# Patient Record
Sex: Female | Born: 1990
Health system: Southern US, Community
[De-identification: ages and names within clinical notes are randomized; demographics above are authoritative.]

## PROBLEM LIST (undated history)

## (undated) ENCOUNTER — Inpatient Hospital Stay (HOSPITAL_COMMUNITY): Payer: Self-pay

## (undated) DIAGNOSIS — Z87440 Personal history of urinary (tract) infections: Secondary | ICD-10-CM

## (undated) DIAGNOSIS — A749 Chlamydial infection, unspecified: Secondary | ICD-10-CM

## (undated) DIAGNOSIS — A549 Gonococcal infection, unspecified: Secondary | ICD-10-CM

## (undated) DIAGNOSIS — Z8619 Personal history of other infectious and parasitic diseases: Secondary | ICD-10-CM

## (undated) DIAGNOSIS — K219 Gastro-esophageal reflux disease without esophagitis: Secondary | ICD-10-CM

## (undated) HISTORY — PX: WISDOM TOOTH EXTRACTION: SHX21

## (undated) HISTORY — DX: Gastro-esophageal reflux disease without esophagitis: K21.9

## (undated) HISTORY — DX: Personal history of other infectious and parasitic diseases: Z86.19

## (undated) HISTORY — DX: Personal history of urinary (tract) infections: Z87.440

---
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
## 2012-01-03 ENCOUNTER — Encounter: Payer: Self-pay | Admitting: Registered Nurse

## 2012-01-04 ENCOUNTER — Other Ambulatory Visit: Payer: Medicaid Other

## 2012-01-04 ENCOUNTER — Encounter: Payer: Medicaid Other | Admitting: Obstetrics and Gynecology

## 2012-01-05 ENCOUNTER — Ambulatory Visit (INDEPENDENT_AMBULATORY_CARE_PROVIDER_SITE_OTHER): Payer: Medicaid Other | Admitting: Obstetrics and Gynecology

## 2012-01-05 ENCOUNTER — Other Ambulatory Visit: Payer: Medicaid Other

## 2012-01-05 ENCOUNTER — Encounter: Payer: Self-pay | Admitting: Obstetrics and Gynecology

## 2012-01-05 VITALS — BP 100/60 | Wt 131.0 lb

## 2012-01-05 DIAGNOSIS — R81 Glycosuria: Secondary | ICD-10-CM

## 2012-01-05 DIAGNOSIS — Z331 Pregnant state, incidental: Secondary | ICD-10-CM

## 2012-01-05 LAB — POCT CBG (FASTING - GLUCOSE)-MANUAL ENTRY: Glucose Fasting, POC: 72 mg/dL (ref 70–99)

## 2012-01-05 NOTE — Progress Notes (Signed)
Pt states no concerns today.   

## 2012-01-05 NOTE — Patient Instructions (Signed)
Fetal Movement Counts Patient Name: __________________________________________________ Patient Due Date: ____________________ Kick counts is highly recommended in high risk pregnancies, but it is a good idea for every pregnant woman to do. Start counting fetal movements at 28 weeks of the pregnancy. Fetal movements increase after eating a full meal or eating or drinking something sweet (the blood sugar is higher). It is also important to drink plenty of fluids (well hydrated) before doing the count. Lie on your left side because it helps with the circulation or you can sit in a comfortable chair with your arms over your belly (abdomen) with no distractions around you. DOING THE COUNT  Try to do the count the same time of day each time you do it.   Mark the day and time, then see how long it takes for you to feel 10 movements (kicks, flutters, swishes, rolls). You should have at least 10 movements within 2 hours. You will most likely feel 10 movements in much less than 2 hours. If you do not, wait an hour and count again. After a couple of days you will see a pattern.   What you are looking for is a change in the pattern or not enough counts in 2 hours. Is it taking longer in time to reach 10 movements?  SEEK MEDICAL CARE IF:  You feel less than 10 counts in 2 hours. Tried twice.   No movement in one hour.   The pattern is changing or taking longer each day to reach 10 counts in 2 hours.   You feel the baby is not moving as it usually does.  Date: ____________ Movements: ____________ Start time: ____________ Finish time: ____________  Date: ____________ Movements: ____________ Start time: ____________ Finish time: ____________ Date: ____________ Movements: ____________ Start time: ____________ Finish time: ____________ Date: ____________ Movements: ____________ Start time: ____________ Finish time: ____________ Date: ____________ Movements: ____________ Start time: ____________ Finish time:  ____________ Date: ____________ Movements: ____________ Start time: ____________ Finish time: ____________ Date: ____________ Movements: ____________ Start time: ____________ Finish time: ____________ Date: ____________ Movements: ____________ Start time: ____________ Finish time: ____________  Date: ____________ Movements: ____________ Start time: ____________ Finish time: ____________ Date: ____________ Movements: ____________ Start time: ____________ Finish time: ____________ Date: ____________ Movements: ____________ Start time: ____________ Finish time: ____________ Date: ____________ Movements: ____________ Start time: ____________ Finish time: ____________ Date: ____________ Movements: ____________ Start time: ____________ Finish time: ____________ Date: ____________ Movements: ____________ Start time: ____________ Finish time: ____________ Date: ____________ Movements: ____________ Start time: ____________ Finish time: ____________  Date: ____________ Movements: ____________ Start time: ____________ Finish time: ____________ Date: ____________ Movements: ____________ Start time: ____________ Finish time: ____________ Date: ____________ Movements: ____________ Start time: ____________ Finish time: ____________ Date: ____________ Movements: ____________ Start time: ____________ Finish time: ____________ Date: ____________ Movements: ____________ Start time: ____________ Finish time: ____________ Date: ____________ Movements: ____________ Start time: ____________ Finish time: ____________ Date: ____________ Movements: ____________ Start time: ____________ Finish time: ____________  Date: ____________ Movements: ____________ Start time: ____________ Finish time: ____________ Date: ____________ Movements: ____________ Start time: ____________ Finish time: ____________ Date: ____________ Movements: ____________ Start time: ____________ Finish time: ____________ Date: ____________ Movements:  ____________ Start time: ____________ Finish time: ____________ Date: ____________ Movements: ____________ Start time: ____________ Finish time: ____________ Date: ____________ Movements: ____________ Start time: ____________ Finish time: ____________ Date: ____________ Movements: ____________ Start time: ____________ Finish time: ____________  Date: ____________ Movements: ____________ Start time: ____________ Finish time: ____________ Date: ____________ Movements: ____________ Start time: ____________ Finish time: ____________ Date: ____________ Movements: ____________ Start time:   ____________ Finish time: ____________ Date: ____________ Movements: ____________ Start time: ____________ Finish time: ____________ Date: ____________ Movements: ____________ Start time: ____________ Finish time: ____________ Date: ____________ Movements: ____________ Start time: ____________ Finish time: ____________ Date: ____________ Movements: ____________ Start time: ____________ Finish time: ____________  Date: ____________ Movements: ____________ Start time: ____________ Finish time: ____________ Date: ____________ Movements: ____________ Start time: ____________ Finish time: ____________ Date: ____________ Movements: ____________ Start time: ____________ Finish time: ____________ Date: ____________ Movements: ____________ Start time: ____________ Finish time: ____________ Date: ____________ Movements: ____________ Start time: ____________ Finish time: ____________ Date: ____________ Movements: ____________ Start time: ____________ Finish time: ____________ Date: ____________ Movements: ____________ Start time: ____________ Finish time: ____________  Date: ____________ Movements: ____________ Start time: ____________ Finish time: ____________ Date: ____________ Movements: ____________ Start time: ____________ Finish time: ____________ Date: ____________ Movements: ____________ Start time: ____________ Finish  time: ____________ Date: ____________ Movements: ____________ Start time: ____________ Finish time: ____________ Date: ____________ Movements: ____________ Start time: ____________ Finish time: ____________ Date: ____________ Movements: ____________ Start time: ____________ Finish time: ____________ Date: ____________ Movements: ____________ Start time: ____________ Finish time: ____________  Date: ____________ Movements: ____________ Start time: ____________ Finish time: ____________ Date: ____________ Movements: ____________ Start time: ____________ Finish time: ____________ Date: ____________ Movements: ____________ Start time: ____________ Finish time: ____________ Date: ____________ Movements: ____________ Start time: ____________ Finish time: ____________ Date: ____________ Movements: ____________ Start time: ____________ Finish time: ____________ Date: ____________ Movements: ____________ Start time: ____________ Finish time: ____________ Document Released: 08/18/2006 Document Revised: 07/08/2011 Document Reviewed: 02/18/2009 Exit[employer] Patient Information 2012 [employer]. 

## 2012-01-05 NOTE — Progress Notes (Signed)
Addended by: Janeece Agee on: 01/05/2012 11:05 AM   Modules accepted: Orders

## 2012-01-05 NOTE — Progress Notes (Signed)
A/P GBS positive.  gc and chlamydia done today Fetal kick counts reviewed Labor reviewed with pt All patients  questions answered

## 2012-01-10 ENCOUNTER — Telehealth: Payer: Self-pay

## 2012-01-10 MED ORDER — AZITHROMYCIN 250 MG PO TABS: ORAL_TABLET | ORAL | Status: AC

## 2012-01-10 NOTE — Telephone Encounter (Signed)
Message copied by Rolla Plate on Mon Jan 10, 2012 10:36 AM ------      Message from: Jaymes Graff      Created: Sat Jan 08, 2012 11:03 PM       Please give the pt Zithromax 250 mg.  Take four tablets at once.  No refills. May treat partner per protocol guidelines.

## 2012-01-10 NOTE — Telephone Encounter (Signed)
Lm on v tcb rgd labs 

## 2012-01-10 NOTE — Telephone Encounter (Signed)
Spoke with Tracy Haynes rgd labs informed gc neg ct positive need treatment Tracy Haynes wants rx sent to [facility] advised Tracy Haynes rx sent to pharm no intercourse until after toc Tracy Haynes has appt for toc on 01/27/12 at 1045 with SL Tracy Haynes voice understanding also informed Tracy Haynes to notify partner of ct so he can get treatment Tracy Haynes voice understanding

## 2012-01-12 ENCOUNTER — Encounter: Payer: Self-pay | Admitting: Obstetrics and Gynecology

## 2012-01-12 ENCOUNTER — Ambulatory Visit (INDEPENDENT_AMBULATORY_CARE_PROVIDER_SITE_OTHER): Payer: Medicaid Other | Admitting: Obstetrics and Gynecology

## 2012-01-12 VITALS — BP 90/56 | Wt 132.0 lb

## 2012-01-12 DIAGNOSIS — N760 Acute vaginitis: Secondary | ICD-10-CM

## 2012-01-12 DIAGNOSIS — A499 Bacterial infection, unspecified: Secondary | ICD-10-CM

## 2012-01-12 DIAGNOSIS — Z3490 Encounter for supervision of normal pregnancy, unspecified, unspecified trimester: Secondary | ICD-10-CM

## 2012-01-12 DIAGNOSIS — B9689 Other specified bacterial agents as the cause of diseases classified elsewhere: Secondary | ICD-10-CM | POA: Insufficient documentation

## 2012-01-12 DIAGNOSIS — Z331 Pregnant state, incidental: Secondary | ICD-10-CM

## 2012-01-12 LAB — POCT WET PREP (WET MOUNT): Clue Cells Wet Prep Whiff POC: NEGATIVE

## 2012-01-12 MED ORDER — METRONIDAZOLE 500 MG PO TABS: 500.0000 mg | ORAL_TABLET | Freq: Two times a day (BID) | ORAL | Status: AC

## 2012-01-12 NOTE — Addendum Note (Signed)
Addended by: Janeece Agee on: 01/12/2012 05:22 PM   Modules accepted: Orders

## 2012-01-12 NOTE — Progress Notes (Signed)
Pt had soda before visit today

## 2012-01-12 NOTE — Progress Notes (Signed)
Doing well.  Received rx for Zithromax for chlamydia.  Now c/o yellow d/c with odor. Wet prep BV--Rx MTZ 500 mg po BID x 7 days.

## 2012-01-12 NOTE — Addendum Note (Signed)
Addended by: Cornelius Moras on: 01/12/2012 05:31 PM   Modules accepted: Orders

## 2012-01-19 ENCOUNTER — Encounter: Payer: Self-pay | Admitting: Obstetrics and Gynecology

## 2012-01-19 ENCOUNTER — Ambulatory Visit (INDEPENDENT_AMBULATORY_CARE_PROVIDER_SITE_OTHER): Payer: Medicaid Other | Admitting: Obstetrics and Gynecology

## 2012-01-19 VITALS — BP 90/58 | Wt 134.0 lb

## 2012-01-19 DIAGNOSIS — A749 Chlamydial infection, unspecified: Secondary | ICD-10-CM

## 2012-01-19 DIAGNOSIS — O98319 Other infections with a predominantly sexual mode of transmission complicating pregnancy, unspecified trimester: Secondary | ICD-10-CM

## 2012-01-19 DIAGNOSIS — A568 Sexually transmitted chlamydial infection of other sites: Secondary | ICD-10-CM

## 2012-01-19 NOTE — Progress Notes (Signed)
Doing well--took Zmax last week. Plan TOC in 3 weeks, or when admitted in labor. Cervix very favorable. Does not plan waterbirth.

## 2012-01-19 NOTE — Progress Notes (Signed)
reqs cx check  Today

## 2012-01-27 ENCOUNTER — Ambulatory Visit (INDEPENDENT_AMBULATORY_CARE_PROVIDER_SITE_OTHER): Payer: Medicaid Other | Admitting: Obstetrics and Gynecology

## 2012-01-27 ENCOUNTER — Encounter: Payer: Self-pay | Admitting: Obstetrics and Gynecology

## 2012-01-27 VITALS — BP 104/60 | Wt 136.0 lb

## 2012-01-27 DIAGNOSIS — Z331 Pregnant state, incidental: Secondary | ICD-10-CM

## 2012-01-27 NOTE — Progress Notes (Signed)
C/o pain in both legs  cx check Too early for TOC

## 2012-01-27 NOTE — Progress Notes (Signed)
[redacted]w[redacted]d GBS done today - will cancel, swab was pos in May, plan to tx in labor Will do TOC NV Denies CTX Swept membranes rv'd FKC and labor sx's

## 2012-01-27 NOTE — Patient Instructions (Signed)

## 2012-02-01 ENCOUNTER — Encounter: Payer: Self-pay | Admitting: Obstetrics and Gynecology

## 2012-02-01 ENCOUNTER — Ambulatory Visit (INDEPENDENT_AMBULATORY_CARE_PROVIDER_SITE_OTHER): Payer: Medicaid Other | Admitting: Obstetrics and Gynecology

## 2012-02-01 ENCOUNTER — Telehealth: Payer: Self-pay | Admitting: Obstetrics and Gynecology

## 2012-02-01 ENCOUNTER — Other Ambulatory Visit: Payer: Medicaid Other

## 2012-02-01 VITALS — BP 90/58 | Wt 139.0 lb

## 2012-02-01 DIAGNOSIS — Z8619 Personal history of other infectious and parasitic diseases: Secondary | ICD-10-CM

## 2012-02-01 DIAGNOSIS — O480 Post-term pregnancy: Secondary | ICD-10-CM

## 2012-02-01 NOTE — Progress Notes (Signed)
No concerns today 

## 2012-02-01 NOTE — Progress Notes (Signed)
Doing well.  Reviewed post-dates management. Patient declines induction prior to 66 weeks--R&B reviewed, with risks of LGA, prolonged labor, shoulder dystocia, etc. Will do NST, [place] for growth, BPP, AFI  At 41 weeks. Cervix very favorable, membranes swept. TOC Chlamydia today. Reviewed + GBS and plan for treatment in labor.

## 2012-02-01 NOTE — Telephone Encounter (Signed)
TC to Tracy Haynes in response to VM regarding treatment for +Chlamydia.  Informed was treated 01/08/12 and TOC will be done in 3 weeks from that date or when in labor.

## 2012-02-01 NOTE — Addendum Note (Signed)
Addended by: Janeece Agee on: 02/01/2012 12:23 PM   Modules accepted: Orders

## 2012-02-03 ENCOUNTER — Encounter: Payer: Self-pay | Admitting: Obstetrics and Gynecology

## 2012-02-03 LAB — GC/CHLAMYDIA PROBE AMP, GENITAL
Chlamydia, DNA Probe: UNDETERMINED
GC Probe Amp, Genital: NEGATIVE

## 2012-02-04 ENCOUNTER — Telehealth: Payer: Self-pay | Admitting: Obstetrics and Gynecology

## 2012-02-04 NOTE — Telephone Encounter (Signed)
Spoke to patient about test results and informed her that we would have to retest for GC/CT on her next appointment on 02/07/2012 because of indeterminate test results.  Patient voiced understanding & acknowledgement.Marland KitchenMarland KitchenMarland KitchenBerdine Dance CMA

## 2012-02-07 ENCOUNTER — Encounter: Payer: Self-pay | Admitting: Obstetrics and Gynecology

## 2012-02-07 ENCOUNTER — Ambulatory Visit (INDEPENDENT_AMBULATORY_CARE_PROVIDER_SITE_OTHER): Payer: Medicaid Other | Admitting: Obstetrics and Gynecology

## 2012-02-07 ENCOUNTER — Ambulatory Visit (INDEPENDENT_AMBULATORY_CARE_PROVIDER_SITE_OTHER): Payer: Medicaid Other

## 2012-02-07 ENCOUNTER — Telehealth: Payer: Self-pay | Admitting: Emergency Medicine

## 2012-02-07 ENCOUNTER — Telehealth: Payer: Self-pay | Admitting: Obstetrics and Gynecology

## 2012-02-07 ENCOUNTER — Other Ambulatory Visit: Payer: Medicaid Other | Admitting: Obstetrics and Gynecology

## 2012-02-07 VITALS — BP 116/68 | Wt 140.0 lb

## 2012-02-07 DIAGNOSIS — Z3400 Encounter for supervision of normal first pregnancy, unspecified trimester: Secondary | ICD-10-CM

## 2012-02-07 DIAGNOSIS — O98319 Other infections with a predominantly sexual mode of transmission complicating pregnancy, unspecified trimester: Secondary | ICD-10-CM

## 2012-02-07 DIAGNOSIS — A549 Gonococcal infection, unspecified: Secondary | ICD-10-CM

## 2012-02-07 DIAGNOSIS — O480 Post-term pregnancy: Secondary | ICD-10-CM

## 2012-02-07 DIAGNOSIS — A5400 Gonococcal infection of lower genitourinary tract, unspecified: Secondary | ICD-10-CM

## 2012-02-07 DIAGNOSIS — A749 Chlamydial infection, unspecified: Secondary | ICD-10-CM

## 2012-02-07 DIAGNOSIS — A568 Sexually transmitted chlamydial infection of other sites: Secondary | ICD-10-CM

## 2012-02-07 DIAGNOSIS — Z331 Pregnant state, incidental: Secondary | ICD-10-CM

## 2012-02-07 LAB — US OB COMP + 14 WK

## 2012-02-07 NOTE — Progress Notes (Signed)
NO COMPLAINTS. PT REQUEST CX CHECK. NST reactive: BPP8/8 AFI NL Menbranes stripped GC/CH done Will schedule induction

## 2012-02-07 NOTE — Patient Instructions (Signed)
Labor Induction  Most women go into labor on their own between 37 and 42 weeks of the pregnancy. When this does not happen or when there is a medical need, medicine or other methods may be used to induce labor. Labor induction causes a pregnant woman's uterus to contract. It also causes the cervix to soften (ripen), open (dilate), and thin out (efface). Usually, labor is not induced before 39 weeks of the pregnancy unless there is a problem with the baby or mother. Whether your labor will be induced depends on a number of factors, including the following:  The medical condition of you and the baby.   How many weeks along you are.   The status of baby's lung maturity.   The condition of the cervix.   The position of the baby.  REASONS FOR LABOR INDUCTION  The health of the baby or mother is at risk.   The pregnancy is overdue by 1 week or more.   The water breaks but labor does not start on its own.   The mother has a health condition or serious illness such as high blood pressure, infection, placental abruption, or diabetes.   The amniotic fluid amounts are low around the baby.   The baby is distressed.  REASONS TO NOT INDUCE LABOR Labor induction may not be a good idea if:  It is shown that your baby does not tolerate labor.   An induction is just more convenient.   You want the baby to be born on a certain date, like a holiday.   You have had previous surgeries on your uterus, such as a myomectomy or the removal of fibroids.   Your placenta lies very low in the uterus and blocks the opening of the cervix (placenta previa).   Your baby is not in a head down position.   The umbilical cord drops down into the birth canal in front of the baby. This could cut off the baby's blood and oxygen supply.   You have had a previous cesarean delivery.   There areunusual circumstances, such as the baby being extremely premature.  RISKS AND COMPLICATIONS Problems may occur in the  process of induction and plans may need to be modified as a situation unfolds. Some of the risks of induction include:  Change in fetal heart rate, such as too high, too low, or erratic.   Risk of fetal distress.   Risk of infection to mother and baby.   Increased chance of having a cesarean delivery.   The rare, but increased chance that the placenta will separate from the uterus (abruption).   Uterine rupture (very rare).  When induction is needed for medical reasons, the benefits of induction may outweigh the risks. BEFORE THE PROCEDURE Your caregiver will check your cervix and the baby's position. This will help your caregiver decide if you are far enough along for an induction to work. PROCEDURE Several methods of labor induction may be used, such as:   Taking prostaglandin medicine to dilate and ripen the cervix. The medicine will also start contractions. It can be taken by mouth or by inserting a suppository into the vagina.   A thin tube (catheter) with a balloon on the end may be inserted into your vagina to dilate the cervix. Once inserted, the balloon expands with water, which causes the cervix to open.   Striping the membranes. Your caregiver inserts a finger between the cervix and membranes, which causes the cervix to be stretched and   may cause the uterus to contract. This is often done during an office visit. You will be sent home to wait for the contractions to begin. You will then come in for an induction.   Breaking the water. Your caregiver will make a hole in the amniotic sac using a small instrument. Once the amniotic sac breaks, contractions should begin. This may still take hours to see an effect.   Taking medicine to trigger or strengthen contractions. This medicine is given intravenously through a tube in your arm.  All of the methods of induction, besides stripping the membranes, will be done in the hospital. Induction is done in the hospital so that you and the  baby can be carefully monitored. AFTER THE PROCEDURE Some inductions can take up to 2 or 3 days. Depending on the cervix, it usually takes less time. It takes longer when you are induced early in the pregnancy or if this is your first pregnancy. If a mother is still pregnant and the induction has been going on for 2 to 3 days, either the mother will be sent home or a cesarean delivery will be needed. Document Released: 12/08/2006 Document Revised: 07/08/2011 Document Reviewed: 05/24/2011 Exit[employer] Patient Information 2012 [employer]. 

## 2012-02-08 ENCOUNTER — Telehealth (HOSPITAL_COMMUNITY): Payer: Self-pay | Admitting: *Deleted

## 2012-02-08 ENCOUNTER — Inpatient Hospital Stay (HOSPITAL_COMMUNITY): Admission: RE | Admit: 2012-02-08 | Payer: Medicaid Other | Source: Ambulatory Visit

## 2012-02-08 ENCOUNTER — Telehealth: Payer: Self-pay | Admitting: Obstetrics and Gynecology

## 2012-02-08 ENCOUNTER — Inpatient Hospital Stay (HOSPITAL_COMMUNITY)
Admission: RE | Admit: 2012-02-08 | Discharge: 2012-02-11 | DRG: 775 | Disposition: A | Discharge status: Home / Self Care | Payer: Medicaid Other | Source: Ambulatory Visit | Attending: Obstetrics and Gynecology | Admitting: Obstetrics and Gynecology

## 2012-02-08 ENCOUNTER — Encounter (HOSPITAL_COMMUNITY): Payer: Self-pay | Admitting: *Deleted

## 2012-02-08 ENCOUNTER — Encounter (HOSPITAL_COMMUNITY): Payer: Self-pay

## 2012-02-08 ENCOUNTER — Other Ambulatory Visit: Payer: Self-pay | Admitting: Obstetrics and Gynecology

## 2012-02-08 DIAGNOSIS — O99892 Other specified diseases and conditions complicating childbirth: Secondary | ICD-10-CM | POA: Diagnosis present

## 2012-02-08 DIAGNOSIS — Z22330 Carrier of Group B streptococcus: Secondary | ICD-10-CM

## 2012-02-08 DIAGNOSIS — O9903 Anemia complicating the puerperium: Secondary | ICD-10-CM | POA: Diagnosis not present

## 2012-02-08 DIAGNOSIS — O480 Post-term pregnancy: Principal | ICD-10-CM | POA: Diagnosis present

## 2012-02-08 DIAGNOSIS — S3141XA Laceration without foreign body of vagina and vulva, initial encounter: Secondary | ICD-10-CM | POA: Diagnosis not present

## 2012-02-08 DIAGNOSIS — D649 Anemia, unspecified: Secondary | ICD-10-CM | POA: Diagnosis not present

## 2012-02-08 LAB — CBC
HCT: 32.1 % — ABNORMAL LOW (ref 36.0–46.0)
Hemoglobin: 10.8 g/dL — ABNORMAL LOW (ref 12.0–15.0)
RDW: 13.3 % (ref 11.5–15.5)
WBC: 10.3 10*3/uL (ref 4.0–10.5)

## 2012-02-08 MED ORDER — LACTATED RINGERS IV SOLN
INTRAVENOUS | Status: DC
  Administered 2012-02-08 – 2012-02-09 (×2): via INTRAVENOUS

## 2012-02-08 MED ORDER — LACTATED RINGERS IV SOLN: 500.0000 mL | INTRAVENOUS | Status: DC | PRN

## 2012-02-08 MED ORDER — PENICILLIN G POTASSIUM 5000000 UNITS IJ SOLR
5.0000 10*6.[IU] | Freq: Once | INTRAVENOUS | Status: AC
  Administered 2012-02-08: 5 10*6.[IU] via INTRAVENOUS
  Filled 2012-02-08: qty 5

## 2012-02-08 MED ORDER — FLEET ENEMA 7-19 GM/118ML RE ENEM: 1.0000 | ENEMA | RECTAL | Status: DC | PRN

## 2012-02-08 MED ORDER — PHENYLEPHRINE 40 MCG/ML (10ML) SYRINGE FOR IV PUSH (FOR BLOOD PRESSURE SUPPORT): 80.0000 ug | PREFILLED_SYRINGE | INTRAVENOUS | Status: DC | PRN

## 2012-02-08 MED ORDER — PENICILLIN G POTASSIUM 5000000 UNITS IJ SOLR
2.5000 10*6.[IU] | INTRAVENOUS | Status: DC
  Administered 2012-02-09 (×3): 2.5 10*6.[IU] via INTRAVENOUS
  Filled 2012-02-08 (×5): qty 2.5

## 2012-02-08 MED ORDER — OXYTOCIN 40 UNITS IN LACTATED RINGERS INFUSION - SIMPLE MED
62.5000 mL/h | Freq: Once | INTRAVENOUS | Status: DC
  Filled 2012-02-08: qty 1000

## 2012-02-08 MED ORDER — CITRIC ACID-SODIUM CITRATE 334-500 MG/5ML PO SOLN: 30.0000 mL | ORAL | Status: DC | PRN

## 2012-02-08 MED ORDER — OXYTOCIN BOLUS FROM INFUSION
250.0000 mL | Freq: Once | INTRAVENOUS | Status: AC
  Administered 2012-02-09: 250 mL via INTRAVENOUS
  Filled 2012-02-08: qty 500

## 2012-02-08 MED ORDER — DIPHENHYDRAMINE HCL 50 MG/ML IJ SOLN: 12.5000 mg | INTRAMUSCULAR | Status: DC | PRN

## 2012-02-08 MED ORDER — OXYCODONE-ACETAMINOPHEN 5-325 MG PO TABS: 1.0000 | ORAL_TABLET | ORAL | Status: DC | PRN

## 2012-02-08 MED ORDER — BUTORPHANOL TARTRATE 2 MG/ML IJ SOLN: 2.0000 mg | INTRAMUSCULAR | Status: DC | PRN

## 2012-02-08 MED ORDER — EPHEDRINE 5 MG/ML INJ: 10.0000 mg | INTRAVENOUS | Status: DC | PRN

## 2012-02-08 MED ORDER — PROMETHAZINE HCL 25 MG/ML IJ SOLN: 12.5000 mg | INTRAMUSCULAR | Status: DC | PRN

## 2012-02-08 MED ORDER — ONDANSETRON HCL 4 MG/2ML IJ SOLN: 4.0000 mg | Freq: Four times a day (QID) | INTRAMUSCULAR | Status: DC | PRN

## 2012-02-08 MED ORDER — FENTANYL 2.5 MCG/ML BUPIVACAINE 1/10 % EPIDURAL INFUSION (WH - ANES): 14.0000 mL/h | INTRAMUSCULAR | Status: DC

## 2012-02-08 MED ORDER — LACTATED RINGERS IV SOLN: 500.0000 mL | Freq: Once | INTRAVENOUS | Status: DC

## 2012-02-08 MED ORDER — IBUPROFEN 600 MG PO TABS: 600.0000 mg | ORAL_TABLET | Freq: Four times a day (QID) | ORAL | Status: DC | PRN

## 2012-02-08 MED ORDER — LIDOCAINE HCL (PF) 1 % IJ SOLN
30.0000 mL | INTRAMUSCULAR | Status: DC | PRN
Start: 2012-02-08 — End: 2012-02-09
  Administered 2012-02-09: 30 mL via SUBCUTANEOUS
  Filled 2012-02-08: qty 30

## 2012-02-08 MED ORDER — ACETAMINOPHEN 325 MG PO TABS: 650.0000 mg | ORAL_TABLET | ORAL | Status: DC | PRN

## 2012-02-08 NOTE — H&P (Signed)
Tracy Haynes is a 21 y.o. female presenting for induction. Tracy Haynes  21 y.o. 65w1denies uc, denies srom, vag bleeding, with +FM, History Hx of +GC/+CHL with pg with test of cure neg 02/01/12 OB History    Grav Para Term Preterm Abortions TAB SAB Ect Mult Living   1 0 0 0 0 0 0 0 0 0      Past Medical History  Diagnosis Date  . Chlamydia infection   . Gonorrhea   . H/O varicella   . Hx: UTI (urinary tract infection)    Past Surgical History  Procedure Date  . Wisdom tooth extraction    Family History: family history includes Alcohol abuse in her father; Arthritis in her mother; Asthma in her brother; Diabetes in her maternal aunt; Hypertension in her mother; Thalassemia in her brother; and Thyroid disease in her mother.  There is no history of Anesthesia problems. Social History:  reports that she has never smoked. She has never used smokeless tobacco. She reports that she does not drink alcohol or use illicit drugs.   Prenatal Transfer Tool  Maternal Diabetes: No Genetic Screening: Normal Maternal Ultrasounds/Referrals: Normal Fetal Ultrasounds or other Referrals:  None Maternal Substance Abuse:  No Significant Maternal Medications:  None Significant Maternal Lab Results:  Lab values include: Group B Strep positive Other Comments:  None  ROS  Dilation: 2 Effacement (%): 90 Station: -1;0 Exam by:: Tracy Haynes, cnm Blood pressure 125/75, pulse 99, temperature 98 F (36.7 C), temperature source Oral, resp. rate 20, height 5\' 1"  (1.549 m), weight 140 lb (63.504 kg), last menstrual period 04/06/2011. Exam Physical Exam Calm, no distress, HEENT WNL,  lungs clear bilaterally, AP RRR, abd soft nt,no masses, not tympanic bowel sounds active, abdomen nontender, EFW 7#0 Vtx per exam intact No edema to lower extremities, foley bulb placed easily with pt consent Fhts category 1 uc q 2 mild  Prenatal labs: ABO, Rh: B/Positive/-- (01/14 0000) Antibody: Negative (01/14  0000) Rubella: Immune (01/14 0000) RPR: NON REAC (04/17 1652)  HBsAg: Negative (01/14 0000)  HIV: Non-reactive (01/14 0000)  GBS: POSITIVE (06/27 1154)  US anatomy WNL Assessment/Plan: 41 1/7 week IUP GBS+ Induction postdates Routine admission, foley bulb placed discussed IV pitocin if indicated, Pen G started, collaboration with Dr. Pennie Rushing per telephone.  Tracy Haynes 02/08/2012, 9:49 PM

## 2012-02-08 NOTE — Telephone Encounter (Signed)
Preadmission screen  

## 2012-02-08 NOTE — Telephone Encounter (Signed)
Induction REscheduled to 02/08/12 @ 7:30pm with MK/VH, per patient request. -Adrianne Pridgen

## 2012-02-08 NOTE — Progress Notes (Signed)
Breathing well uc O VSS      fhts +      abd soft between uc      Contractions q 2-4 mild to mod      Vag 4 100 foley bulb remains in A  41 week induction P continue care Lavera Guise, CNM

## 2012-02-09 ENCOUNTER — Inpatient Hospital Stay (HOSPITAL_COMMUNITY): Admission: RE | Admit: 2012-02-09 | Payer: Medicaid Other | Source: Ambulatory Visit

## 2012-02-09 ENCOUNTER — Encounter (HOSPITAL_COMMUNITY): Payer: Self-pay

## 2012-02-09 DIAGNOSIS — S3141XA Laceration without foreign body of vagina and vulva, initial encounter: Secondary | ICD-10-CM | POA: Diagnosis not present

## 2012-02-09 LAB — RPR: RPR Ser Ql: NONREACTIVE

## 2012-02-09 LAB — ABO/RH: ABO/RH(D): B POS

## 2012-02-09 MED ORDER — TERBUTALINE SULFATE 1 MG/ML IJ SOLN: 0.2500 mg | Freq: Once | INTRAMUSCULAR | Status: DC | PRN

## 2012-02-09 MED ORDER — ONDANSETRON HCL 4 MG PO TABS: 4.0000 mg | ORAL_TABLET | ORAL | Status: DC | PRN

## 2012-02-09 MED ORDER — MAGNESIUM HYDROXIDE 400 MG/5ML PO SUSP: 30.0000 mL | ORAL | Status: DC | PRN

## 2012-02-09 MED ORDER — OXYTOCIN 40 UNITS IN LACTATED RINGERS INFUSION - SIMPLE MED
1.0000 m[IU]/min | INTRAVENOUS | Status: DC
  Administered 2012-02-09: 1 m[IU]/min via INTRAVENOUS

## 2012-02-09 MED ORDER — LANOLIN HYDROUS EX OINT: TOPICAL_OINTMENT | CUTANEOUS | Status: DC | PRN

## 2012-02-09 MED ORDER — BENZOCAINE-MENTHOL 20-0.5 % EX AERO
1.0000 "application " | INHALATION_SPRAY | CUTANEOUS | Status: DC | PRN
  Administered 2012-02-09: 1 via TOPICAL
  Filled 2012-02-09: qty 56

## 2012-02-09 MED ORDER — TETANUS-DIPHTH-ACELL PERTUSSIS 5-2.5-18.5 LF-MCG/0.5 IM SUSP: 0.5000 mL | Freq: Once | INTRAMUSCULAR | Status: DC

## 2012-02-09 MED ORDER — OXYCODONE-ACETAMINOPHEN 5-325 MG PO TABS
1.0000 | ORAL_TABLET | ORAL | Status: DC | PRN
  Administered 2012-02-09 – 2012-02-11 (×8): 1 via ORAL
  Filled 2012-02-09 (×8): qty 1

## 2012-02-09 MED ORDER — WITCH HAZEL-GLYCERIN EX PADS: 1.0000 "application " | MEDICATED_PAD | CUTANEOUS | Status: DC | PRN

## 2012-02-09 MED ORDER — MEDROXYPROGESTERONE ACETATE 150 MG/ML IM SUSP: 150.0000 mg | INTRAMUSCULAR | Status: DC | PRN

## 2012-02-09 MED ORDER — ZOLPIDEM TARTRATE 5 MG PO TABS: 5.0000 mg | ORAL_TABLET | Freq: Every evening | ORAL | Status: DC | PRN

## 2012-02-09 MED ORDER — IBUPROFEN 600 MG PO TABS
600.0000 mg | ORAL_TABLET | Freq: Four times a day (QID) | ORAL | Status: DC
  Administered 2012-02-09 – 2012-02-11 (×7): 600 mg via ORAL
  Filled 2012-02-09 (×7): qty 1

## 2012-02-09 MED ORDER — SENNOSIDES-DOCUSATE SODIUM 8.6-50 MG PO TABS
2.0000 | ORAL_TABLET | Freq: Every day | ORAL | Status: DC
  Administered 2012-02-09 – 2012-02-10 (×2): 2 via ORAL

## 2012-02-09 MED ORDER — DIBUCAINE 1 % RE OINT: 1.0000 "application " | TOPICAL_OINTMENT | RECTAL | Status: DC | PRN

## 2012-02-09 MED ORDER — PRENATAL MULTIVITAMIN CH
1.0000 | ORAL_TABLET | Freq: Every day | ORAL | Status: DC
  Administered 2012-02-09 – 2012-02-11 (×3): 1 via ORAL
  Filled 2012-02-09 (×3): qty 1

## 2012-02-09 MED ORDER — DIPHENHYDRAMINE HCL 25 MG PO CAPS: 25.0000 mg | ORAL_CAPSULE | Freq: Four times a day (QID) | ORAL | Status: DC | PRN

## 2012-02-09 MED ORDER — SIMETHICONE 80 MG PO CHEW: 80.0000 mg | CHEWABLE_TABLET | ORAL | Status: DC | PRN

## 2012-02-09 MED ORDER — ONDANSETRON HCL 4 MG/2ML IJ SOLN: 4.0000 mg | INTRAMUSCULAR | Status: DC | PRN

## 2012-02-09 NOTE — Progress Notes (Signed)
Subjective: Pt resting on Lt side on arrival to room.  Doula and family members at bedside & supportive.  Pt able to rest, but not asleep.  No other c/o's. Pit on 9mu.  Objective: BP 103/47  Pulse 98  Temp 98.6 F (37 C) (Oral)  Resp 18  Ht 5\' 1"  (1.549 m)  Wt 140 lb (63.504 kg)  BMI 26.45 kg/m2  LMP 04/06/2011      FHT:  FHR: 140 bpm, variability: moderate,  accelerations:  Present,  decelerations:  Absent UC:   regular, every 2-3 minutes SVE:   Dilation: 7 Effacement (%): 100 Station: -1;0 Exam by:: C. Teasha Murrillo, CNM AROM at 0807 sm amt of clear fluid; sm amt of bloody show Labs: Lab Results  Component Value Date   WBC 10.3 02/08/2012   HGB 10.8* 02/08/2012   HCT 32.1* 02/08/2012   MCV 83.4 02/08/2012   PLT 286 02/08/2012    Assessment / Plan: 1. 41.2  2.  no change in 2 hrs 3. GBS pos  Labor: protracted active Preeclampsia:  no signs or symptoms of toxicity Fetal Wellbeing:  Category I Pain Control:  Labor support without medications I/D:  n/a Anticipated MOD:  NSVD 1.  Pitocin cut in half and will CTO for labor progression 2.  Support as needed  Annmarie Plemmons H 02/09/2012, 8:15 AM

## 2012-02-09 NOTE — Progress Notes (Signed)
Called by RN foley blub out,  Calm, quiet breathing well with uc O VSS      fhts category 1      abd soft between uc      Contractions 2-6 mild to mod      Vag 6 100 -1/0 VTX Intact foley bulb out A  Foley bulb Induction 412/7 week  P discussed pitocin augmentation and pt concurs, continue care Lavera Guise, CNM

## 2012-02-09 NOTE — Progress Notes (Signed)
Calm with uc O VSS      fhts category 1      abd soft between uc      Contractions q 1-3 mild to mod      Vag 7 100 -1/0 intact A active labor P continue care Lavera Guise, CNM

## 2012-02-10 ENCOUNTER — Inpatient Hospital Stay (HOSPITAL_COMMUNITY): Admission: RE | Admit: 2012-02-10 | Payer: Medicaid Other | Source: Ambulatory Visit

## 2012-02-10 LAB — CBC
MCH: 29 pg (ref 26.0–34.0)
Platelets: 249 10*3/uL (ref 150–400)
RBC: 3.1 MIL/uL — ABNORMAL LOW (ref 3.87–5.11)
WBC: 14 10*3/uL — ABNORMAL HIGH (ref 4.0–10.5)

## 2012-02-10 MED ORDER — POLYSACCHARIDE IRON COMPLEX 150 MG PO CAPS
150.0000 mg | ORAL_CAPSULE | Freq: Two times a day (BID) | ORAL | Status: DC
  Administered 2012-02-10 – 2012-02-11 (×3): 150 mg via ORAL
  Filled 2012-02-10 (×3): qty 1

## 2012-02-10 NOTE — Progress Notes (Signed)
Patient ID: GLORISTINE TURRUBIATES, female   DOB: 03-01-1991, 21 y.o.   MRN: [id number] Post Partum Day 1   Subjective: no complaints, up ad lib without syncope, voiding, tolerating PO, + flatus  Pain well controlled with po meds BF started Mood stable, bonding well Desires nexplanon for contraception    Objective: Blood pressure 101/62, pulse 94, temperature 98.1 F (36.7 C), temperature source Oral, resp. rate 18, height 5\' 1"  (1.549 m), weight 140 lb (63.504 kg), last menstrual period 04/06/2011, unknown if currently breastfeeding.  Physical Exam:  General: alert, fatigued and no distress Lungs: CTAB Heart: RRR Breasts: WNL Lochia: appropriate Uterine Fundus: firm Perineum: healing well approximated  DVT Evaluation: No evidence of DVT seen on physical exam. Negative Homan's sign. No significant calf/ankle edema.   Basename 02/10/12 0610 02/08/12 2040  HGB 9.0* 10.8*  HCT 26.4* 32.1*    Assessment/Plan: Mild anemia FE supplement PO  Plan D/C tmrw BF, lactation consult nexplanon for contraception     LOS: 2 days   Annebelle Bostic M 02/10/2012, 10:43 AM

## 2012-02-10 NOTE — Progress Notes (Signed)
UR Chart review completed.  

## 2012-02-11 MED ORDER — IBUPROFEN 600 MG PO TABS: 600.0000 mg | ORAL_TABLET | Freq: Four times a day (QID) | ORAL | Status: AC | PRN

## 2012-02-11 MED ORDER — DOCUSATE SODIUM 100 MG PO CAPS: 100.0000 mg | ORAL_CAPSULE | Freq: Two times a day (BID) | ORAL | Status: DC

## 2012-02-11 MED ORDER — FERROUS SULFATE 325 (65 FE) MG PO TABS: 325.0000 mg | ORAL_TABLET | Freq: Every day | ORAL | Status: DC

## 2012-02-11 NOTE — Discharge Summary (Signed)
Physician Discharge Summary  Patient ID: ARUNA NESTLER MRN: [id number] DOB/AGE: 1991-07-02 21 y.o.  Admit date: 02/08/2012 Discharge date: 02/11/2012   week Admission Diagnoses: 40 5/7 week IUP induction for post dates  Discharge Diagnoses:  Principal Problem:  *NSVD (normal spontaneous vaginal delivery) Active Problems:  Vaginal laceration anemia  Discharged Condition: stable  Hospital Course: 40 5/7 week IUP induction for post dates, foley bulb, IV pitocin, SVD, 2 degree vag lac, normal involution, anemia   Consults: None  Significant Diagnostic Studies: labs:  Hemoglobin & Hematocrit     Component Value Date/Time   HGB 9.0* 02/10/2012 0610   HCT 26.4* 02/10/2012 0610     Treatments: antibiotics: pen G  Discharge Exam: Blood pressure 100/61, pulse 76, temperature 98.3 F (36.8 C), temperature source Oral, resp. rate 18, height 5\' 1"  (1.549 m), weight 140 lb (63.504 kg), last menstrual period 04/06/2011, unknown if currently breastfeeding. General appearance: alert, cooperative and no distress S: comfortable, little bleeding, slept little     breastfeeding O VSS     abd soft, nt, ff      sm  flowperineum clean intact     -Homans sign bilaterally,         no edema A normal involution     Lactating anemia     PP day 2  Disposition: 01-Home or Self Care  Discharge Orders    Future Appointments: Provider: Department: Dept Phone: Center:   03/22/2012 11:00 AM Hal Morales, MD [facility] [phone] None     Medication List  As of 02/11/2012  8:40 AM   STOP taking these medications         TYLENOL 500 MG tablet         TAKE these medications         calcium carbonate 500 MG chewable tablet   Commonly known as: TUMS - dosed in mg elemental calcium   Chew 2 tablets by mouth daily as needed. For heartburn      docusate sodium 100 MG capsule   Commonly known as: COLACE   Take 1 capsule (100 mg total) by mouth 2 (two) times daily.      ferrous sulfate  325 (65 FE) MG tablet   Take 1 tablet (325 mg total) by mouth daily.      ibuprofen 600 MG tablet   Commonly known as: ADVIL,MOTRIN   Take 1 tablet (600 mg total) by mouth every 6 (six) hours as needed for pain.           [facility] handbook to office for nexplanon  Signed: Alexx Mcburney 02/11/2012, 8:40 AM

## 2012-02-11 NOTE — Discharge Instructions (Signed)
Vaginal Delivery Care After  Change your pad on each trip to the bathroom.   Wipe gently with toilet paper during your hospital stay. Always wipe from front to back. A spray bottle with warm tap water could also be used or a towelette if available.   Place your soiled pad and toilet paper in a bathroom wastebasket with a plastic bag liner.   During your hospital stay, save any clots. If you pass a clot while on the toilet, do not flush it. Also, if your vaginal flow seems excessive to you, notify nursing personnel.   The first time you get out of bed after delivery, wait for assistance from a nurse. Do not get up alone at any time if you feel weak or dizzy.   Bend and extend your ankles forcefully so that you feel the calves of your legs get hard. Do this 6 times every hour when you are in bed and awake.   Do not sit with one foot under you, dangle your legs over the edge of the bed, or maintain a position that hinders the circulation in your legs.   Many women experience after pains for 2 to 3 days after delivery. These after pains are mild uterine contractions. Ask the nurse for a pain medication if you need something for this. Sometimes breastfeeding stimulates after pains; if you find this to be true, ask for the medication  -  hour before the next feeding.   For you and your infant's protection, do not go beyond the door(s) of the obstetric unit. Do not carry your baby in your arms in the hallway. When taking your baby to and from your room, put your baby in the bassinet and push the bassinet.   Mothers may have their babies in their room as much as they desire.  Document Released: 07/16/2000 Document Revised: 07/08/2011 Document Reviewed: 06/16/2007 [facility] Patient Information 2012 [place], [place]. Breastfeeding BENEFITS OF BREASTFEEDING For the baby  The first milk (colostrum) helps the baby's digestive system function better.   There are antibodies from the mother in the milk  that help the baby fight off infections.   The baby has a lower incidence of asthma, allergies, and SIDS (sudden infant death syndrome).   The nutrients in breast milk are better than formulas for the baby and helps the baby's brain grow better.   Babies who breastfeed have less gas, colic, and constipation.  For the mother  Breastfeeding helps develop a very special bond between mother and baby.   It is more convenient, always available at the correct temperature and cheaper than formula feeding.   It burns calories in the mother and helps with losing weight that was gained during pregnancy.   It makes the uterus contract back down to normal size faster and slows bleeding following delivery.   Breastfeeding mothers have a lower risk of developing breast cancer.  NURSE FREQUENTLY  A healthy, full-term baby may breastfeed as often as every hour or space his or her feedings to every 3 hours.   How often to nurse will vary from baby to baby. Watch your baby for signs of hunger, not the clock.   Nurse as often as the baby requests, or when you feel the need to reduce the fullness of your breasts.   Awaken the baby if it has been 3 to 4 hours since the last feeding.   Frequent feeding will help the mother make more milk and will prevent problems like  sore nipples and engorgement of the breasts.  BABY'S POSITION AT THE BREAST  Whether lying down or sitting, be sure that the baby's tummy is facing your tummy.   Support the breast with 4 fingers underneath the breast and the thumb above. Make sure your fingers are well away from the nipple and baby's mouth.   Stroke the baby's lips and cheek closest to the breast gently with your finger or nipple.   When the baby's mouth is open wide enough, place all of your nipple and as much of the dark area around the nipple as possible into your baby's mouth.   Pull the baby in close so the tip of the nose and the baby's cheeks touch the breast  during the feeding.  FEEDINGS  The length of each feeding varies from baby to baby and from feeding to feeding.   The baby must suck about 2 to 3 minutes for your milk to get to him or her. This is called a "let down." For this reason, allow the baby to feed on each breast as long as he or she wants. Your baby will end the feeding when he or she has received the right balance of nutrients.   To break the suction, put your finger into the corner of the baby's mouth and slide it between his or her gums before removing your breast from his or her mouth. This will help prevent sore nipples.  REDUCING BREAST ENGORGEMENT  In the first week after your baby is born, you may experience signs of breast engorgement. When breasts are engorged, they feel heavy, warm, full, and may be tender to the touch. You can reduce engorgement if you:   Nurse frequently, every 2 to 3 hours. Mothers who breastfeed early and often have fewer problems with engorgement.   Place light ice packs on your breasts between feedings. This reduces swelling. Wrap the ice packs in a lightweight towel to protect your skin.   Apply moist hot packs to your breast for 5 to 10 minutes before each feeding. This increases circulation and helps the milk flow.   Gently massage your breast before and during the feeding.   Make sure that the baby empties at least one breast at every feeding before switching sides.   Use a breast pump to empty the breasts if your baby is sleepy or not nursing well. You may also want to pump if you are returning to work or or you feel you are getting engorged.   Avoid bottle feeds, pacifiers or supplemental feedings of water or juice in place of breastfeeding.   Be sure the baby is latched on and positioned properly while breastfeeding.   Prevent fatigue, stress, and anemia.   Wear a supportive bra, avoiding underwire styles.   Eat a balanced diet with enough fluids.  If you follow these suggestions,  your engorgement should improve in 24 to 48 hours. If you are still experiencing difficulty, call your lactation consultant or caregiver. IS MY BABY GETTING ENOUGH MILK? Sometimes, mothers worry about whether their babies are getting enough milk. You can be assured that your baby is getting enough milk if:  The baby is actively sucking and you hear swallowing.   The baby nurses at least 8 to 12 times in a 24 hour time period. Nurse your baby until he or she unlatches or falls asleep at the first breast (at least 10 to 20 minutes), then offer the second side.   The baby  is wetting 5 to 6 disposable diapers (6 to 8 cloth diapers) in a 24 hour period by 54 to 18 days of age.   The baby is having at least 2 to 3 stools every 24 hours for the first few months. Breast milk is all the food your baby needs. It is not necessary for your baby to have water or formula. In fact, to help your breasts make more milk, it is best not to give your baby supplemental feedings during the early weeks.   The stool should be soft and yellow.   The baby should gain 4 to 7 ounces per week after he is 35 days old.  TAKE CARE OF YOURSELF Take care of your breasts by:  Bathing or showering daily.   Avoiding the use of soaps on your nipples.   Start feedings on your left breast at one feeding and on your right breast at the next feeding.   You will notice an increase in your milk supply 2 to 5 days after delivery. You may feel some discomfort from engorgement, which makes your breasts very firm and often tender. Engorgement "peaks" out within 24 to 48 hours. In the meantime, apply warm moist towels to your breasts for 5 to 10 minutes before feeding. Gentle massage and expression of some milk before feeding will soften your breasts, making it easier for your baby to latch on. Wear a well fitting nursing bra and air dry your nipples for 10 to 15 minutes after each feeding.   Only use cotton bra pads.   Only use pure  lanolin on your nipples after nursing. You do not need to wash it off before nursing.  Take care of yourself by:   Eating well-balanced meals and nutritious snacks.   Drinking milk, fruit juice, and water to satisfy your thirst (about 8 glasses a day).   Getting plenty of rest.   Increasing calcium in your diet (1200 mg a day).   Avoiding foods that you notice affect the baby in a bad way.  SEEK MEDICAL CARE IF:   You have any questions or difficulty with breastfeeding.   You need help.   You have a hard, red, sore area on your breast, accompanied by a fever of 100.5 F (38.1 C) or more.   Your baby is too sleepy to eat well or is having trouble sleeping.   Your baby is wetting less than 6 diapers per day, by 55 days of age.   Your baby's skin or white part of his or her eyes is more yellow than it was in the hospital.   You feel depressed.  Document Released: 07/19/2005 Document Revised: 07/08/2011 Document Reviewed: 03/03/2009 [facility] Patient Information 2012 [place], [place]. Anemia, Frequently Asked Questions WHAT ARE THE SYMPTOMS OF ANEMIA?  Headache.   Difficulty thinking.   Fatigue.   Shortness of breath.   Weakness.   Rapid heartbeat.  AT WHAT POINT ARE PEOPLE CONSIDERED ANEMIC?  This varies with gender and age.   Both hemoglobin (Hgb) and hematocrit values are used to define anemia. These lab values are obtained from a complete blood count (CBC) test. This is performed at a caregiver's office.   The normal range of hemoglobin values for adult men is 14.0 g/dL to 16.1 g/dL. For nonpregnant women, values are 12.3 g/dL to 09.6 g/dL.   The World Health Organization defines anemia as less than 12 g/dL for nonpregnant women and less than 13 g/dL for men.   For  adult males, the average normal hematocrit is 46%, and the range is 40% to 52%.   For adult females, the average normal hematocrit is 41%, and the range is 35% to 47%.   Values that fall below the  lower limits can be a sign of anemia and should have further checking (evaluation).  GROUPS OF PEOPLE WHO ARE AT RISK FOR DEVELOPING ANEMIA INCLUDE:   Infants who are breastfed or taking a formula that is not fortified with iron.   Children going through a rapid growth spurt. The iron available can not keep up with the needs for a red cell mass which must grow with the child.   Women in childbearing years. They need iron because of blood loss during menstruation.   Pregnant women. The growing fetus creates a high demand for iron.   People with ongoing gastrointestinal blood loss are at risk of developing iron deficiency.   Individuals with leukemia or cancer who must receive chemotherapy or radiation to treat their disease. The drugs or radiation used to treat these diseases often decreases the bone marrow's ability to make cells of all classes. This includes red blood cells, white blood cells, and platelets.   Individuals with chronic inflammatory conditions such as rheumatoid arthritis or chronic infections.   The elderly.  ARE SOME TYPES OF ANEMIA INHERITED?   Yes, some types of anemia are due to inherited or genetic defects.   Sickle cell anemia. This occurs most often in people of African, African American, and Mediterranean descent.   Thalassemia (or Cooley's anemia). This type is found in people of Mediterranean and Southeast Asian descent. These types of anemia are common.   [place]. This is rare.  CAN CERTAIN MEDICATIONS CAUSE A PERSON TO BECOME ANEMIC?  Yes. For example, drugs to fight cancer (chemotherapeutic agents) often cause anemia. These drugs can slow the bone marrow's ability to make red blood cells. If there are not enough red blood cells, the body does not get enough oxygen. WHAT HEMATOCRIT LEVEL IS REQUIRED TO DONATE BLOOD?  The lower limit of an acceptable hematocrit for blood donors is 38%. If you have a low hematocrit value, you should schedule an appointment  with your caregiver. ARE BLOOD TRANSFUSIONS COMMONLY USED TO CORRECT ANEMIA, AND ARE THEY DANGEROUS?  They are used to treat anemia as a last resort. Your caregiver will find the cause of the anemia and correct it if possible. Most blood transfusions are given because of excessive bleeding at the time of surgery, with trauma, or because of bone marrow suppression in patients with cancer or leukemia on chemotherapy. Blood transfusions are safer than ever before. We also know that blood transfusions affect the immune system and may increase certain risks. There is also a concern for human error. In 1/16,000 transfusions, a patient receives a transfusion of blood that is not matched with his or her blood type.  WHAT IS IRON DEFICIENCY ANEMIA AND CAN I CORRECT IT BY CHANGING MY DIET?  Iron is an essential part of hemoglobin. Without enough hemoglobin, anemia develops and the body does not get the right amount of oxygen. Iron deficiency anemia develops after the body has had a low level of iron for a long time. This is either caused by blood loss, not taking in or absorbing enough iron, or increased demands for iron (like pregnancy or rapid growth).  Foods from animal origin such as beef, chicken, and pork, are good sources of iron. Be sure to have one of these  foods at each meal. Vitamin C helps your body absorb iron. Foods rich in Vitamin C include citrus, bell pepper, strawberries, spinach and cantaloupe. In some cases, iron supplements may be needed in order to correct the iron deficiency. In the case of poor absorption, extra iron may have to be given directly into the vein through a needle (intravenously). I HAVE BEEN DIAGNOSED WITH IRON DEFICIENCY ANEMIA AND MY CAREGIVER PRESCRIBED IRON SUPPLEMENTS. HOW LONG WILL IT TAKE FOR MY BLOOD TO BECOME NORMAL?  It depends on the degree of anemia at the beginning of treatment. Most people with mild to moderate iron deficiency, anemia will correct the anemia over a  period of 2 to 3 months. But after the anemia is corrected, the iron stored by the body is still low. Caregivers often suggest an additional 6 months of oral iron therapy once the anemia has been reversed. This will help prevent the iron deficiency anemia from quickly happening again. Non-anemic adult males should take iron supplements only under the direction of a doctor, too much iron can cause liver damage.  MY HEMOGLOBIN IS 9 G/DL AND I AM SCHEDULED FOR SURGERY. SHOULD I POSTPONE THE SURGERY?  If you have Hgb of 9, you should discuss this with your caregiver right away. Many patients with similar hemoglobin levels have had surgery without problems. If minimal blood loss is expected for a minor procedure, no treatment may be necessary.  If a greater blood loss is expected for more extensive procedures, you should ask your caregiver about being treated with erythropoietin and iron. This is to accelerate the recovery of your hemoglobin to a normal level before surgery. An anemic patient who undergoes high-blood-loss surgery has a greater risk of surgical complications and need for a blood transfusion, which also carries some risk.  I HAVE BEEN TOLD THAT HEAVY MENSTRUAL PERIODS CAUSE ANEMIA. IS THERE ANYTHING I CAN DO TO PREVENT THE ANEMIA?  Anemia that results from heavy periods is usually due to iron deficiency. You can try to meet the increased demands for iron caused by the heavy monthly blood loss by increasing the intake of iron-rich foods. Iron supplements may be required. Discuss your concerns with your caregiver. WHAT CAUSES ANEMIA DURING PREGNANCY?  Pregnancy places major demands on the body. The mother must meet the needs of both her body and her growing baby. The body needs enough iron and folate to make the right amount of red blood cells. To prevent anemia while pregnant, the mother should stay in close contact with her caregiver.  Be sure to eat a diet that has foods rich in iron and folate  like liver and dark green leafy vegetables. Folate plays an important role in the normal development of a baby's spinal cord. Folate can help prevent serious disorders like spina bifida. If your diet does not provide adequate nutrients, you may want to talk with your caregiver about nutritional supplements.  WHAT IS THE RELATIONSHIP BETWEEN FIBROID TUMORS AND ANEMIA IN WOMEN?  The relationship is usually caused by the increased menstrual blood loss caused by fibroids. Good iron intake may be required to prevent iron deficiency anemia from developing.  Document Released: 02/25/2004 Document Revised: 07/08/2011 Document Reviewed: 08/11/2010 [facility] Patient Information 2012 [place], [place].

## 2012-03-03 ENCOUNTER — Encounter (HOSPITAL_COMMUNITY): Payer: Medicaid Other

## 2012-03-06 ENCOUNTER — Inpatient Hospital Stay (HOSPITAL_COMMUNITY): Admission: RE | Admit: 2012-03-06 | Payer: Medicaid Other | Source: Ambulatory Visit

## 2012-03-22 ENCOUNTER — Ambulatory Visit (INDEPENDENT_AMBULATORY_CARE_PROVIDER_SITE_OTHER): Payer: Medicaid Other | Admitting: Obstetrics and Gynecology

## 2012-03-22 ENCOUNTER — Encounter: Payer: Self-pay | Admitting: Obstetrics and Gynecology

## 2012-03-22 NOTE — Progress Notes (Signed)
Subjective:   Post-Partum:  Date of delivery: 02/09/2012 Female Name: George Hugh Vaginal delivery:yes Cesarean section:no Tubal ligation:no GDM:no Breast Feeding:yes Bottle Feeding:yes Post-Partum Blues:no Abnormal pap:no Normal GU function: yes Normal GI function:yes Returning to work:yes EPDS: 3    Tracy Haynes is a 21 y.o. female who presents for a postpartum visit.  I have fully reviewed the prenatal and intrapartum course.    Patient is not sexually active.   The following portions of the patient's history were reviewed and updated as appropriate: allergies, current medications, past family history, past medical history, past social history, past surgical history and problem list.  Review of Systems Pertinent items are noted in HPI.   Objective:    BP 110/64  Ht 5\' 1"  (1.549 m)  Wt 116 lb (52.617 kg)  BMI 21.92 kg/m2  LMP 03/17/2012  Breastfeeding? Yes  General:  alert, cooperative and no distress     Lungs: clear to auscultation bilaterally  Heart:  regular rate and rhythm, S1, S2 normal, no murmur  Abdomen: soft, non-tender; bowel sounds normal; no masses,  no organomegaly   Vulva:  normal  Vagina: normal vagina  Cervix:  normal  Corpus: normal size, contour, position, consistency, mobility, non-tender  Adnexa:  normal adnexa             Assessment:     Normal postpartum exam.  Pap smear not done at today's visit. due at age 60  Plan:  RTO for Nexplanon ASAP  Dierdre Forth MD 03/22/2012 11:40 AM

## 2012-03-23 NOTE — Telephone Encounter (Deleted)
Complete

## 2012-03-30 ENCOUNTER — Encounter: Payer: Self-pay | Admitting: Obstetrics and Gynecology

## 2012-03-30 ENCOUNTER — Ambulatory Visit (INDEPENDENT_AMBULATORY_CARE_PROVIDER_SITE_OTHER): Payer: Medicaid Other | Admitting: Obstetrics and Gynecology

## 2012-03-30 VITALS — BP 110/68 | HR 70 | Wt 118.0 lb

## 2012-03-30 DIAGNOSIS — Z30017 Encounter for initial prescription of implantable subdermal contraceptive: Secondary | ICD-10-CM

## 2012-03-30 LAB — POCT URINE PREGNANCY: Preg Test, Ur: NEGATIVE

## 2012-03-30 MED ORDER — ETONOGESTREL 68 MG ~~LOC~~ IMPL
68.0000 mg | DRUG_IMPLANT | Freq: Once | SUBCUTANEOUS | Status: AC
  Administered 2012-03-30: 68 mg via SUBCUTANEOUS

## 2012-03-30 NOTE — Progress Notes (Deleted)
21 YO for Nexplanon insertion.  Has read brochure, denies any  questions and has signed consent.  O:  Nexplanon inserted per protocol in medial left upper arm without difficulty.  Palpated by clinician and patient. Dressed with sterile band-aid, 4 x 4 gauze       and Kling pressure dressing  UPT-negative   A: Nexplanon Insertion  P: Reviewed signs and symptoms of infection and wound care      Call with any problems or concerns      Use barrier contraception for the next  4 weeks      RTO-1 week for follow up.  Rachelle Edwards, PA-C

## 2012-03-30 NOTE — Progress Notes (Signed)
21 YO for Nexplanon insertion.  Has read brochure, denies any  questions and has signed consent.  O:  Nexplanon inserted per protocol in medial left upper arm without difficulty.  Palpated by clinician and patient. Dressed with sterile band-aid, 4 x 4 gauze       and Kling pressure dressing  UPT-negative   A: Nexplanon Insertion  P: Reviewed signs and symptoms of infection and wound care      Call with any problems or concerns      Use barrier contraception for the next  4 weeks      RTO-1 week for follow up.  Cailan Antonucci, PA-C 

## 2012-03-30 NOTE — Patient Instructions (Addendum)
Keep follow up appointment in 1 week  Call [facility] [phone]:  -for temperature of 100.4 degrees Fahrenheit or more -pain not improved with over the counter pain medications (Ibuprofen, Advil, Aleve,     Tylenol or acetaminophen) -for excessive bleeding from insertion site -for excessive swelling redness or green drainage from your insertion site -for any other concerns  Use a back-up method of birth control for the next 4 weeks  

## 2012-04-07 ENCOUNTER — Encounter: Payer: Self-pay | Admitting: Obstetrics and Gynecology

## 2012-04-07 ENCOUNTER — Ambulatory Visit (INDEPENDENT_AMBULATORY_CARE_PROVIDER_SITE_OTHER): Payer: Medicaid Other | Admitting: Obstetrics and Gynecology

## 2012-04-07 VITALS — BP 98/60 | HR 62 | Wt 115.0 lb

## 2012-04-07 DIAGNOSIS — Z309 Encounter for contraceptive management, unspecified: Secondary | ICD-10-CM

## 2012-04-07 NOTE — Progress Notes (Signed)
21 YO with Nexplanon insertion last week for follow up.  Denies any complaints but noticed bruising.  O: Medial left upper arm: palpable Nexplanon (felt by patient & clinician) with tenderness or evidence of infection;  bruising encompassing a 1 cm border around     device  A: Nexplanon Follow up  P: RTO- March 2014 AEx or prn  Elloise Roark, PA-C

## 2012-04-19 ENCOUNTER — Telehealth: Payer: Self-pay | Admitting: Obstetrics and Gynecology

## 2012-04-19 MED ORDER — FLUCONAZOLE 150 MG PO TABS: 150.0000 mg | ORAL_TABLET | Freq: Once | ORAL | Status: DC

## 2012-04-19 MED ORDER — NYSTATIN 100000 UNIT/GM EX CREA: 1.0000 "application " | TOPICAL_CREAM | CUTANEOUS | Status: DC | PRN

## 2012-04-19 NOTE — Telephone Encounter (Signed)
TRIAGE/RX REQ °

## 2012-04-19 NOTE — Telephone Encounter (Signed)
TC TO PT REGARDING MESSAGE. PT STATES THAT SHE TOOK HER BABY TO THE DOCTOR AND WAS TOLD PT BABY HAS THRUSH AND PT STATES THAT THE DR.SAYS THAT SHE NEED TO GET TREATED AS WELL(PT IS BREAST FEEDING). PER DEE CALL IN RX TO PT PHARMACY FOR DIFLUCAN 150 MG 1 PO STAT 0 RF AND FOR NYSTATIN CREAM 1 TUBE PRN TO USE ON NIPPLES.WITH 0 RF.TO PT PHARMACY. PT VOICED UNDERSTANDING.

## 2012-10-24 ENCOUNTER — Emergency Department (HOSPITAL_COMMUNITY)
Admission: EM | Admit: 2012-10-24 | Discharge: 2012-10-24 | Disposition: A | Discharge status: Home / Self Care | Payer: No Typology Code available for payment source | Attending: Emergency Medicine | Admitting: Emergency Medicine

## 2012-10-24 ENCOUNTER — Emergency Department (HOSPITAL_COMMUNITY): Payer: No Typology Code available for payment source

## 2012-10-24 ENCOUNTER — Encounter (HOSPITAL_COMMUNITY): Payer: Self-pay | Admitting: Emergency Medicine

## 2012-10-24 DIAGNOSIS — Y92410 Unspecified street and highway as the place of occurrence of the external cause: Secondary | ICD-10-CM | POA: Insufficient documentation

## 2012-10-24 DIAGNOSIS — R197 Diarrhea, unspecified: Secondary | ICD-10-CM | POA: Insufficient documentation

## 2012-10-24 DIAGNOSIS — Z79899 Other long term (current) drug therapy: Secondary | ICD-10-CM | POA: Insufficient documentation

## 2012-10-24 DIAGNOSIS — K529 Noninfective gastroenteritis and colitis, unspecified: Secondary | ICD-10-CM

## 2012-10-24 DIAGNOSIS — IMO0002 Reserved for concepts with insufficient information to code with codable children: Secondary | ICD-10-CM | POA: Insufficient documentation

## 2012-10-24 DIAGNOSIS — R112 Nausea with vomiting, unspecified: Secondary | ICD-10-CM | POA: Insufficient documentation

## 2012-10-24 DIAGNOSIS — S20219A Contusion of unspecified front wall of thorax, initial encounter: Secondary | ICD-10-CM

## 2012-10-24 DIAGNOSIS — K5289 Other specified noninfective gastroenteritis and colitis: Secondary | ICD-10-CM | POA: Insufficient documentation

## 2012-10-24 DIAGNOSIS — R109 Unspecified abdominal pain: Secondary | ICD-10-CM | POA: Insufficient documentation

## 2012-10-24 DIAGNOSIS — Y9389 Activity, other specified: Secondary | ICD-10-CM | POA: Insufficient documentation

## 2012-10-24 DIAGNOSIS — Z8619 Personal history of other infectious and parasitic diseases: Secondary | ICD-10-CM | POA: Insufficient documentation

## 2012-10-24 DIAGNOSIS — Z3202 Encounter for pregnancy test, result negative: Secondary | ICD-10-CM | POA: Insufficient documentation

## 2012-10-24 DIAGNOSIS — Z87440 Personal history of urinary (tract) infections: Secondary | ICD-10-CM | POA: Insufficient documentation

## 2012-10-24 LAB — CBC WITH DIFFERENTIAL/PLATELET
Basophils Absolute: 0 10*3/uL (ref 0.0–0.1)
Eosinophils Relative: 0 % (ref 0–5)
HCT: 39.5 % (ref 36.0–46.0)
Lymphocytes Relative: 5 % — ABNORMAL LOW (ref 12–46)
Lymphs Abs: 0.7 10*3/uL (ref 0.7–4.0)
MCV: 81.6 fL (ref 78.0–100.0)
Monocytes Absolute: 0.8 10*3/uL (ref 0.1–1.0)
Neutro Abs: 12.7 10*3/uL — ABNORMAL HIGH (ref 1.7–7.7)
Platelets: 302 10*3/uL (ref 150–400)
RBC: 4.84 MIL/uL (ref 3.87–5.11)
WBC: 14.3 10*3/uL — ABNORMAL HIGH (ref 4.0–10.5)

## 2012-10-24 LAB — BASIC METABOLIC PANEL
CO2: 26 mEq/L (ref 19–32)
Calcium: 9 mg/dL (ref 8.4–10.5)
Chloride: 102 mEq/L (ref 96–112)
Glucose, Bld: 94 mg/dL (ref 70–99)
Sodium: 138 mEq/L (ref 135–145)

## 2012-10-24 LAB — POCT PREGNANCY, URINE: Preg Test, Ur: NEGATIVE

## 2012-10-24 LAB — TROPONIN I: Troponin I: <0.3 ng/mL (ref <0.30)

## 2012-10-24 MED ORDER — SODIUM CHLORIDE 0.9 % IV BOLUS (SEPSIS)
1000.0000 mL | Freq: Once | INTRAVENOUS | Status: AC
  Administered 2012-10-24: 1000 mL via INTRAVENOUS

## 2012-10-24 MED ORDER — IOHEXOL 300 MG/ML  SOLN
80.0000 mL | Freq: Once | INTRAMUSCULAR | Status: AC | PRN
  Administered 2012-10-24: 80 mL via INTRAVENOUS

## 2012-10-24 MED ORDER — PROMETHAZINE HCL 25 MG PO TABS: 25.0000 mg | ORAL_TABLET | Freq: Four times a day (QID) | ORAL | Status: DC | PRN

## 2012-10-24 MED ORDER — ONDANSETRON HCL 4 MG/2ML IJ SOLN
4.0000 mg | Freq: Once | INTRAMUSCULAR | Status: AC
  Administered 2012-10-24: 4 mg via INTRAVENOUS

## 2012-10-24 MED ORDER — CYCLOBENZAPRINE HCL 10 MG PO TABS: 10.0000 mg | ORAL_TABLET | Freq: Two times a day (BID) | ORAL | Status: DC | PRN

## 2012-10-24 MED ORDER — TRAMADOL HCL 50 MG PO TABS: 50.0000 mg | ORAL_TABLET | Freq: Four times a day (QID) | ORAL | Status: DC | PRN
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
## 2012-10-30 ENCOUNTER — Emergency Department (HOSPITAL_COMMUNITY)
Admission: EM | Admit: 2012-10-30 | Discharge: 2012-10-30 | Disposition: A | Discharge status: Home / Self Care | Payer: Self-pay | Source: Home / Self Care

## 2012-10-30 ENCOUNTER — Encounter (HOSPITAL_COMMUNITY): Payer: Self-pay

## 2012-10-30 DIAGNOSIS — Z0000 Encounter for general adult medical examination without abnormal findings: Secondary | ICD-10-CM

## 2012-10-30 NOTE — ED Provider Notes (Signed)
History     CSN: [id number]  Arrival date & time 10/30/12  1313   First MD Initiated Contact with Patient 10/30/12 1322      Chief Complaint  Patient presents with  . Establish Care    (Consider location/radiation/quality/duration/timing/severity/associated sxs/prior treatment) HPI Patient is 22 year old female who presents for regular followup and requires annual physical exam. She reports being in usual state of health and denies any recent sicknesses or hospitalizations. She denies chest pain or shortness of breath, no other systemic symptoms. Past Medical History  Diagnosis Date  . Chlamydia infection   . Gonorrhea   . H/O varicella   . Hx: UTI (urinary tract infection)     Past Surgical History  Procedure Laterality Date  . Wisdom tooth extraction      Family History  Problem Relation Age of Onset  . Anesthesia problems Neg Hx   . Hypertension Mother   . Arthritis Mother   . Thyroid disease Mother   . Asthma Brother   . Thalassemia Brother   . Alcohol abuse Father   . Diabetes Maternal Aunt     History  Substance Use Topics  . Smoking status: Never Smoker   . Smokeless tobacco: Never Used  . Alcohol Use: No    OB History   Grav Para Term Preterm Abortions TAB SAB Ect Mult Living   1 1 1  0 0 0 0 0 0 1      Review of Systems  Constitutional: Negative for fever, chills, diaphoresis, activity change, appetite change and fatigue.  HENT: Negative for ear pain, nosebleeds, congestion, facial swelling, rhinorrhea, neck pain, neck stiffness and ear discharge.   Eyes: Negative for pain, discharge, redness, itching and visual disturbance.  Respiratory: Negative for cough, choking, chest tightness, shortness of breath, wheezing and stridor.   Cardiovascular: Negative for chest pain, palpitations and leg swelling.  Gastrointestinal: Negative for abdominal distention.  Genitourinary: Negative for dysuria, urgency, frequency, hematuria, flank pain, decreased urine  volume, difficulty urinating and dyspareunia.  Musculoskeletal: Negative for back pain, joint swelling, arthralgias and gait problem.  Neurological: Negative for dizziness, tremors, seizures, syncope, facial asymmetry, speech difficulty, weakness, light-headedness, numbness and headaches.  Hematological: Negative for adenopathy. Does not bruise/bleed easily.  Psychiatric/Behavioral: Negative for hallucinations, behavioral problems, confusion, dysphoric mood, decreased concentration and agitation.    Allergies  Blueberry  Home Medications   Current Outpatient Rx  Name  Route  Sig  Dispense  Refill  . cyclobenzaprine (FLEXERIL) 10 MG tablet   Oral   Take 1 tablet (10 mg total) by mouth 2 (two) times daily as needed for muscle spasms.   20 tablet   0   . etonogestrel (NEXPLANON) 68 MG IMPL implant   Subcutaneous   Inject 1 each into the skin once.         Marland Kitchen ibuprofen (ADVIL,MOTRIN) 200 MG tablet   Oral   Take 200 mg by mouth every 6 (six) hours as needed for pain.          . promethazine (PHENERGAN) 25 MG tablet   Oral   Take 1 tablet (25 mg total) by mouth every 6 (six) hours as needed for nausea.   30 tablet   0   . traMADol (ULTRAM) 50 MG tablet   Oral   Take 1 tablet (50 mg total) by mouth every 6 (six) hours as needed for pain.   15 tablet   0     BP 113/72  Pulse 86  Temp(Src)  99 F (37.2 C) (Oral)  SpO2 100%  LMP 10/24/2012  Physical Exam  Constitutional: Appears well-developed and well-nourished. No distress.  HENT: Normocephalic. External right and left ear normal. Oropharynx is clear and moist.  Eyes: Conjunctivae and EOM are normal. PERRLA, no scleral icterus.  Neck: Normal ROM. Neck supple. No JVD. No tracheal deviation. No thyromegaly.  CVS: RRR, S1/S2 +, no murmurs, no gallops, no carotid bruit.  Pulmonary: Effort and breath sounds normal, no stridor, rhonchi, wheezes, rales.  Abdominal: Soft. BS +,  no distension, tenderness, rebound or  guarding.  Musculoskeletal: Normal range of motion. No edema and no tenderness.  Lymphadenopathy: No lymphadenopathy noted, cervical, inguinal. Neuro: Alert. Normal reflexes, muscle tone coordination. No cranial nerve deficit. Skin: Skin is warm and dry. No rash noted. Not diaphoretic. No erythema. No pallor.  Psychiatric: Normal mood and affect. Behavior, judgment, thought content normal.    ED Course  Procedures (including critical care time)  Labs Reviewed - No data to display No results found.   1. Normal physical exam   - physical exam is within normal limits, no findings on today's exam - Advise proper hygiene with regular handwashing, protection use during the intercourse  - Also advise regular self breast exam and brush were provided    MDM  Annual physical exam, normal        Dorothea Ogle, MD 10/30/12 1334

## 2012-10-30 NOTE — ED Notes (Signed)
Patient here to establish primary care dr.

## 2013-08-08 ENCOUNTER — Encounter (HOSPITAL_COMMUNITY): Payer: Self-pay | Admitting: Emergency Medicine

## 2013-08-08 ENCOUNTER — Emergency Department (HOSPITAL_COMMUNITY)
Admission: EM | Admit: 2013-08-08 | Discharge: 2013-08-09 | Disposition: A | Discharge status: Home / Self Care | Payer: No Typology Code available for payment source | Attending: Emergency Medicine | Admitting: Emergency Medicine

## 2013-08-08 DIAGNOSIS — Z79899 Other long term (current) drug therapy: Secondary | ICD-10-CM | POA: Insufficient documentation

## 2013-08-08 DIAGNOSIS — Z8619 Personal history of other infectious and parasitic diseases: Secondary | ICD-10-CM | POA: Insufficient documentation

## 2013-08-08 DIAGNOSIS — Z3202 Encounter for pregnancy test, result negative: Secondary | ICD-10-CM | POA: Insufficient documentation

## 2013-08-08 DIAGNOSIS — R112 Nausea with vomiting, unspecified: Secondary | ICD-10-CM | POA: Insufficient documentation

## 2013-08-08 DIAGNOSIS — Z87442 Personal history of urinary calculi: Secondary | ICD-10-CM | POA: Insufficient documentation

## 2013-08-08 DIAGNOSIS — R197 Diarrhea, unspecified: Secondary | ICD-10-CM | POA: Insufficient documentation

## 2013-08-08 DIAGNOSIS — R1013 Epigastric pain: Secondary | ICD-10-CM | POA: Insufficient documentation

## 2013-08-08 DIAGNOSIS — R109 Unspecified abdominal pain: Secondary | ICD-10-CM

## 2013-08-08 LAB — COMPREHENSIVE METABOLIC PANEL
ALBUMIN: 3.6 g/dL (ref 3.5–5.2)
ALT: 10 U/L (ref 0–35)
AST: 15 U/L (ref 0–37)
Alkaline Phosphatase: 63 U/L (ref 39–117)
BUN: 13 mg/dL (ref 6–23)
CALCIUM: 8.5 mg/dL (ref 8.4–10.5)
CO2: 25 mEq/L (ref 19–32)
CREATININE: 0.62 mg/dL (ref 0.50–1.10)
Chloride: 100 mEq/L (ref 96–112)
GFR calc Af Amer: >90 mL/min (ref >90)
Glucose, Bld: 87 mg/dL (ref 70–99)
Potassium: 3.5 mEq/L — ABNORMAL LOW (ref 3.7–5.3)
Sodium: 137 mEq/L (ref 137–147)
Total Bilirubin: 0.3 mg/dL (ref 0.3–1.2)
Total Protein: 7.3 g/dL (ref 6.0–8.3)

## 2013-08-08 LAB — CBC WITH DIFFERENTIAL/PLATELET
BASOS ABS: 0 10*3/uL (ref 0.0–0.1)
BASOS PCT: 0 % (ref 0–1)
EOS PCT: 1 % (ref 0–5)
Eosinophils Absolute: 0.1 10*3/uL (ref 0.0–0.7)
HEMATOCRIT: 39 % (ref 36.0–46.0)
Hemoglobin: 13.3 g/dL (ref 12.0–15.0)
Lymphocytes Relative: 6 % — ABNORMAL LOW (ref 12–46)
Lymphs Abs: 0.6 10*3/uL — ABNORMAL LOW (ref 0.7–4.0)
MCH: 28.3 pg (ref 26.0–34.0)
MCHC: 34.1 g/dL (ref 30.0–36.0)
MCV: 83 fL (ref 78.0–100.0)
MONO ABS: 1 10*3/uL (ref 0.1–1.0)
Monocytes Relative: 9 % (ref 3–12)
Neutro Abs: 9.6 10*3/uL — ABNORMAL HIGH (ref 1.7–7.7)
Neutrophils Relative %: 84 % — ABNORMAL HIGH (ref 43–77)
Platelets: 324 10*3/uL (ref 150–400)
RBC: 4.7 MIL/uL (ref 3.87–5.11)
RDW: 12.1 % (ref 11.5–15.5)
WBC: 11.4 10*3/uL — ABNORMAL HIGH (ref 4.0–10.5)

## 2013-08-08 LAB — URINALYSIS, ROUTINE W REFLEX MICROSCOPIC
BILIRUBIN URINE: NEGATIVE
Glucose, UA: NEGATIVE mg/dL
HGB URINE DIPSTICK: NEGATIVE
Ketones, ur: 40 mg/dL — AB
Nitrite: NEGATIVE
PROTEIN: 30 mg/dL — AB
Specific Gravity, Urine: 1.029 (ref 1.005–1.030)
UROBILINOGEN UA: 1 mg/dL (ref 0.0–1.0)
pH: 6.5 (ref 5.0–8.0)

## 2013-08-08 LAB — LIPASE, BLOOD: LIPASE: 30 U/L (ref 11–59)

## 2013-08-08 LAB — PREGNANCY, URINE: Preg Test, Ur: NEGATIVE

## 2013-08-08 LAB — URINE MICROSCOPIC-ADD ON

## 2013-08-08 LAB — WET PREP, GENITAL
TRICH WET PREP: NONE SEEN
YEAST WET PREP: NONE SEEN

## 2013-08-08 MED ORDER — MORPHINE SULFATE 4 MG/ML IJ SOLN
4.0000 mg | Freq: Once | INTRAMUSCULAR | Status: AC
  Administered 2013-08-08: 4 mg via INTRAVENOUS
  Filled 2013-08-08: qty 1

## 2013-08-08 MED ORDER — CEFTRIAXONE SODIUM 250 MG IJ SOLR
250.0000 mg | Freq: Once | INTRAMUSCULAR | Status: AC
  Administered 2013-08-08: 250 mg via INTRAMUSCULAR
  Filled 2013-08-08: qty 250

## 2013-08-08 MED ORDER — AZITHROMYCIN 250 MG PO TABS
1000.0000 mg | ORAL_TABLET | Freq: Once | ORAL | Status: AC
  Administered 2013-08-08: 1000 mg via ORAL
  Filled 2013-08-08: qty 4

## 2013-08-08 MED ORDER — ONDANSETRON HCL 4 MG/2ML IJ SOLN
4.0000 mg | Freq: Once | INTRAMUSCULAR | Status: AC
  Administered 2013-08-08: 4 mg via INTRAVENOUS
  Filled 2013-08-08: qty 2

## 2013-08-08 MED ORDER — LIDOCAINE HCL 1 % IJ SOLN
INTRAMUSCULAR | Status: AC
Start: 2013-08-08 — End: 2013-08-08
  Administered 2013-08-08: 0.9 mL
  Filled 2013-08-08: qty 20

## 2013-08-08 MED ORDER — SODIUM CHLORIDE 0.9 % IV BOLUS (SEPSIS)
1000.0000 mL | Freq: Once | INTRAVENOUS | Status: AC
  Administered 2013-08-08: 1000 mL via INTRAVENOUS

## 2013-08-08 NOTE — ED Notes (Signed)
Pt c/o abd pain; mid abd; vomited x 2 today

## 2013-08-08 NOTE — ED Provider Notes (Signed)
CSN: [id number]     Arrival date & time 08/08/13  2135 History   First MD Initiated Contact with Patient 08/08/13 2149     Chief Complaint  Patient presents with  . Abdominal Pain   (Consider location/radiation/quality/duration/timing/severity/associated sxs/prior Treatment) HPI  23 year old female with hx of STD presents c/o abd pain.  Report sudden onset of achy mid abdominal pain that wakes her up at 3am this morning.  Pain has been intermittent, non radiating, 4/10, with associated nausea, 2 bouts of non bloody, non bilious vomit and one bout of non bloody non mucous loose stool.  Pt felt sxs may be related to eating broccoli casserole meal last night which she doesn't normally eat.  Has tried taking Peptol Bismol with minimal relief.  Denies fever, chills, cp, sob, productive cough, dysuria, vaginal discharge, hematochezia or melena.  No prior abd surgery.  No recent trauma, no recent alcohol or rec drug use, no hx of diabetes.  Pt felt that she may have a stomach virus.    Past Medical History  Diagnosis Date  . Chlamydia infection   . Gonorrhea   . H/O varicella   . Hx: UTI (urinary tract infection)    Past Surgical History  Procedure Laterality Date  . Wisdom tooth extraction     Family History  Problem Relation Age of Onset  . Anesthesia problems Neg Hx   . Hypertension Mother   . Arthritis Mother   . Thyroid disease Mother   . Asthma Brother   . Thalassemia Brother   . Alcohol abuse Father   . Diabetes Maternal Aunt    History  Substance Use Topics  . Smoking status: Never Smoker   . Smokeless tobacco: Never Used  . Alcohol Use: No   OB History   Grav Para Term Preterm Abortions TAB SAB Ect Mult Living   1 1 1  0 0 0 0 0 0 1     Review of Systems  Constitutional: Negative for fever.  Gastrointestinal: Positive for nausea, abdominal pain and diarrhea. Negative for rectal pain.  Skin: Negative for rash and wound.  All other systems reviewed and are  negative.    Allergies  Blueberry  Home Medications   Current Outpatient Rx  Name  Route  Sig  Dispense  Refill  . cyclobenzaprine (FLEXERIL) 10 MG tablet   Oral   Take 1 tablet (10 mg total) by mouth 2 (two) times daily as needed for muscle spasms.   20 tablet   0   . etonogestrel (NEXPLANON) 68 MG IMPL implant   Subcutaneous   Inject 1 each into the skin once.         Marland Kitchen ibuprofen (ADVIL,MOTRIN) 200 MG tablet   Oral   Take 200 mg by mouth every 6 (six) hours as needed for pain.          . promethazine (PHENERGAN) 25 MG tablet   Oral   Take 1 tablet (25 mg total) by mouth every 6 (six) hours as needed for nausea.   30 tablet   0   . traMADol (ULTRAM) 50 MG tablet   Oral   Take 1 tablet (50 mg total) by mouth every 6 (six) hours as needed for pain.   15 tablet   0    BP 117/70  Pulse 95  Temp(Src) 97.9 F (36.6 C) (Oral)  Resp 18  Ht 5\' 1"  (1.549 m)  Wt 118 lb (53.524 kg)  BMI 22.31 kg/m2  SpO2  98%  LMP 07/25/2013 Physical Exam  Nursing note and vitals reviewed. Constitutional: She appears well-developed and well-nourished. No distress.  HENT:  Head: Normocephalic and atraumatic.  Eyes: Conjunctivae are normal.  Neck: Normal range of motion. Neck supple.  Cardiovascular: Normal rate and regular rhythm.   Pulmonary/Chest: Effort normal and breath sounds normal. She exhibits no tenderness.  Abdominal: Soft. There is tenderness (mild-moderate tenderness to mid epigastric region without guarding or rebound tenderness, no Murphy sign, no McBurney's point, no hernia noted.  ).  Genitourinary: Vagina normal and uterus normal. There is no rash or lesion on the right labia. There is no rash or lesion on the left labia. Cervix exhibits no motion tenderness and no discharge. Right adnexum displays no mass and no tenderness. Left adnexum displays no mass and no tenderness. No erythema, tenderness or bleeding around the vagina. No vaginal discharge found.  No cva  tenderness  Chaperone present:    Lymphadenopathy:       Right: No inguinal adenopathy present.       Left: No inguinal adenopathy present.  Neurological: She is alert.  Skin: No rash noted.  Psychiatric: She has a normal mood and affect.    ED Course  Procedures (including critical care time)  10:01 PM abd pain with n/v/d, likely viral in etiology.  No peritoneal signs to suggest appy. Doubt biliary disease.  Is afebrile, VSS.  Work up initiated.    11:05 PM Pelvic exam without evidence suggestive of PID.  However wet prep shows WBC TNTC. Since pt has prior STD and has abd pain, i offer prophylactic rocephin/zithromax and pt agrees. UA without UTI, but there are some ketone.  Pt received IVF.  She felt much better now after treatment.  On reexamination minimal abdominal tenderness.  I felt sxs likely viral GI.  Will d/c, return precaution if worsening abd pain.  Although this could be early appy, serial exam would be beneficial if sxs worsen.  Pt is aware and agrees to return if pain worsen.    Labs Review Labs Reviewed  WET PREP, GENITAL - Abnormal; Notable for the following:    Clue Cells Wet Prep HPF POC FEW (*)    WBC, Wet Prep HPF POC TOO NUMEROUS TO COUNT (*)    All other components within normal limits  URINALYSIS, ROUTINE W REFLEX MICROSCOPIC - Abnormal; Notable for the following:    APPearance CLOUDY (*)    Ketones, ur 40 (*)    Protein, ur 30 (*)    Leukocytes, UA SMALL (*)    All other components within normal limits  CBC WITH DIFFERENTIAL - Abnormal; Notable for the following:    WBC 11.4 (*)    Neutrophils Relative % 84 (*)    Neutro Abs 9.6 (*)    Lymphocytes Relative 6 (*)    Lymphs Abs 0.6 (*)    All other components within normal limits  COMPREHENSIVE METABOLIC PANEL - Abnormal; Notable for the following:    Potassium 3.5 (*)    All other components within normal limits  URINE MICROSCOPIC-ADD ON - Abnormal; Notable for the following:    Squamous  Epithelial / LPF MANY (*)    Bacteria, UA FEW (*)    All other components within normal limits  GC/CHLAMYDIA PROBE AMP  URINE CULTURE  PREGNANCY, URINE  LIPASE, BLOOD   Imaging Review No results found.  EKG Interpretation   None       MDM   1. Nausea vomiting and diarrhea  2. Abdominal pain    BP 112/58  Pulse 71  Temp(Src) 97.9 F (36.6 C) (Oral)  Resp 16  Ht 5\' 1"  (1.549 m)  Wt 118 lb (53.524 kg)  BMI 22.31 kg/m2  SpO2 98%  LMP 07/25/2013  I have reviewed nursing notes and vital signs.  I reviewed available ER/hospitalization records thought the EMR     Fayrene Helper, [place] 08/09/13 0004

## 2013-08-09 LAB — GC/CHLAMYDIA PROBE AMP
CT PROBE, AMP APTIMA: NEGATIVE
GC Probe RNA: NEGATIVE

## 2013-08-09 MED ORDER — PROMETHAZINE HCL 25 MG PO TABS: 25.0000 mg | ORAL_TABLET | Freq: Four times a day (QID) | ORAL | Status: DC | PRN

## 2013-08-09 NOTE — Discharge Instructions (Signed)
Viral Gastroenteritis °Viral gastroenteritis is also known as stomach flu. This condition affects the stomach and intestinal tract. It can cause sudden diarrhea and vomiting. The illness typically lasts 3 to 8 days. Most people develop an immune response that eventually gets rid of the virus. While this natural response develops, the virus can make you quite ill. °CAUSES  °Many different viruses can cause gastroenteritis, such as rotavirus or noroviruses. You can catch one of these viruses by consuming contaminated food or water. You may also catch a virus by sharing utensils or other personal items with an infected person or by touching a contaminated surface. °SYMPTOMS  °The most common symptoms are diarrhea and vomiting. These problems can cause a severe loss of body fluids (dehydration) and a body salt (electrolyte) imbalance. Other symptoms may include: °· Fever. °· Headache. °· Fatigue. °· Abdominal pain. °DIAGNOSIS  °Your caregiver can usually diagnose viral gastroenteritis based on your symptoms and a physical exam. A stool sample may also be taken to test for the presence of viruses or other infections. °TREATMENT  °This illness typically goes away on its own. Treatments are aimed at rehydration. The most serious cases of viral gastroenteritis involve vomiting so severely that you are not able to keep fluids down. In these cases, fluids must be given through an intravenous line (IV). °HOME CARE INSTRUCTIONS  °· Drink enough fluids to keep your urine clear or pale yellow. Drink small amounts of fluids frequently and increase the amounts as tolerated. °· Ask your caregiver for specific rehydration instructions. °· Avoid: °· Foods high in sugar. °· Alcohol. °· Carbonated drinks. °· Tobacco. °· Juice. °· Caffeine drinks. °· Extremely hot or cold fluids. °· Fatty, greasy foods. °· Too much intake of anything at one time. °· Dairy products until 24 to 48 hours after diarrhea stops. °· You may consume probiotics.  Probiotics are active cultures of beneficial bacteria. They may lessen the amount and number of diarrheal stools in adults. Probiotics can be found in yogurt with active cultures and in supplements. °· Wash your hands well to avoid spreading the virus. °· Only take over-the-counter or prescription medicines for pain, discomfort, or fever as directed by your caregiver. Do not give aspirin to children. Antidiarrheal medicines are not recommended. °· Ask your caregiver if you should continue to take your regular prescribed and over-the-counter medicines. °· Keep all follow-up appointments as directed by your caregiver. °SEEK IMMEDIATE MEDICAL CARE IF:  °· You are unable to keep fluids down. °· You do not urinate at least once every 6 to 8 hours. °· You develop shortness of breath. °· You notice blood in your stool or vomit. This may look like coffee grounds. °· You have abdominal pain that increases or is concentrated in one small area (localized). °· You have persistent vomiting or diarrhea. °· You have a fever. °· The patient is a child younger than 3 months, and he or she has a fever. °· The patient is a child older than 3 months, and he or she has a fever and persistent symptoms. °· The patient is a child older than 3 months, and he or she has a fever and symptoms suddenly get worse. °· The patient is a baby, and he or she has no tears when crying. °MAKE SURE YOU:  °· Understand these instructions. °· Will watch your condition. °· Will get help right away if you are not doing well or get worse. °Document Released: 07/19/2005 Document Revised: 10/11/2011 Document Reviewed: 05/05/2011 °  [employer] Patient Information 2014 [place], [place].  Abdominal Pain, Women Abdominal (stomach, pelvic, or belly) pain can be caused by many things. It is important to tell your doctor:  The location of the pain.  Does it come and go or is it present all the time?  Are there things that start the pain (eating certain foods,  exercise)?  Are there other symptoms associated with the pain (fever, nausea, vomiting, diarrhea)? All of this is helpful to know when trying to find the cause of the pain. CAUSES   Stomach: virus or bacteria infection, or ulcer.  Intestine: appendicitis (inflamed appendix), regional ileitis (Crohn's disease), ulcerative colitis (inflamed colon), irritable bowel syndrome, diverticulitis (inflamed diverticulum of the colon), or cancer of the stomach or intestine.  Gallbladder disease or stones in the gallbladder.  Kidney disease, kidney stones, or infection.  Pancreas infection or cancer.  Fibromyalgia (pain disorder).  Diseases of the female organs:  Uterus: fibroid (non-cancerous) tumors or infection.  Fallopian tubes: infection or tubal pregnancy.  Ovary: cysts or tumors.  Pelvic adhesions (scar tissue).  Endometriosis (uterus lining tissue growing in the pelvis and on the pelvic organs).  Pelvic congestion syndrome (female organs filling up with blood just before the menstrual period).  Pain with the menstrual period.  Pain with ovulation (producing an egg).  Pain with an IUD (intrauterine device, birth control) in the uterus.  Cancer of the female organs.  Functional pain (pain not caused by a disease, may improve without treatment).  Psychological pain.  Depression. DIAGNOSIS  Your doctor will decide the seriousness of your pain by doing an examination.  Blood tests.  X-rays.  Ultrasound.  CT scan (computed tomography, special type of X-ray).  MRI (magnetic resonance imaging).  Cultures, for infection.  Barium enema (dye inserted in the large intestine, to better view it with X-rays).  Colonoscopy (looking in intestine with a lighted tube).  Laparoscopy (minor surgery, looking in abdomen with a lighted tube).  Major abdominal exploratory surgery (looking in abdomen with a large incision). TREATMENT  The treatment will depend on the cause of the  pain.   Many cases can be observed and treated at home.  Over-the-counter medicines recommended by your caregiver.  Prescription medicine.  Antibiotics, for infection.  Birth control pills, for painful periods or for ovulation pain.  Hormone treatment, for endometriosis.  Nerve blocking injections.  Physical therapy.  Antidepressants.  Counseling with a psychologist or psychiatrist.  Minor or major surgery. HOME CARE INSTRUCTIONS   Do not take laxatives, unless directed by your caregiver.  Take over-the-counter pain medicine only if ordered by your caregiver. Do not take aspirin because it can cause an upset stomach or bleeding.  Try a clear liquid diet (broth or water) as ordered by your caregiver. Slowly move to a bland diet, as tolerated, if the pain is related to the stomach or intestine.  Have a thermometer and take your temperature several times a day, and record it.  Bed rest and sleep, if it helps the pain.  Avoid sexual intercourse, if it causes pain.  Avoid stressful situations.  Keep your follow-up appointments and tests, as your caregiver orders.  If the pain does not go away with medicine or surgery, you may try:  Acupuncture.  Relaxation exercises (yoga, meditation).  Group therapy.  Counseling. SEEK MEDICAL CARE IF:   You notice certain foods cause stomach pain.  Your home care treatment is not helping your pain.  You need stronger pain medicine.  You want  your IUD removed.  You feel faint or lightheaded.  You develop nausea and vomiting.  You develop a rash.  You are having side effects or an allergy to your medicine. SEEK IMMEDIATE MEDICAL CARE IF:   Your pain does not go away or gets worse.  You have a fever.  Your pain is felt only in portions of the abdomen. The right side could possibly be appendicitis. The left lower portion of the abdomen could be colitis or diverticulitis.  You are passing blood in your stools (bright  red or black tarry stools, with or without vomiting).  You have blood in your urine.  You develop chills, with or without a fever.  You pass out. MAKE SURE YOU:   Understand these instructions.  Will watch your condition.  Will get help right away if you are not doing well or get worse. Document Released: 05/16/2007 Document Revised: 10/11/2011 Document Reviewed: 06/05/2009 [facility] Patient Information 2014 [place], [place].

## 2013-08-09 NOTE — ED Provider Notes (Signed)
Medical screening examination/treatment/procedure(s) were performed by non-physician practitioner and as supervising physician I was immediately available for consultation/collaboration.   Ayden Apodaca R Kemora Pinard, MD 08/09/13 0013 

## 2013-08-10 LAB — URINE CULTURE: Colony Count: 85000

## 2014-01-14 ENCOUNTER — Encounter (HOSPITAL_COMMUNITY): Payer: Self-pay | Admitting: Emergency Medicine

## 2014-01-14 ENCOUNTER — Emergency Department (HOSPITAL_COMMUNITY)
Admission: EM | Admit: 2014-01-14 | Discharge: 2014-01-14 | Disposition: A | Discharge status: Home / Self Care | Payer: No Typology Code available for payment source | Attending: Emergency Medicine | Admitting: Emergency Medicine

## 2014-01-14 DIAGNOSIS — Z8619 Personal history of other infectious and parasitic diseases: Secondary | ICD-10-CM | POA: Insufficient documentation

## 2014-01-14 DIAGNOSIS — Z3202 Encounter for pregnancy test, result negative: Secondary | ICD-10-CM | POA: Insufficient documentation

## 2014-01-14 DIAGNOSIS — N390 Urinary tract infection, site not specified: Secondary | ICD-10-CM

## 2014-01-14 LAB — URINALYSIS, ROUTINE W REFLEX MICROSCOPIC
Bilirubin Urine: NEGATIVE
GLUCOSE, UA: NEGATIVE mg/dL
Hgb urine dipstick: NEGATIVE
KETONES UR: NEGATIVE mg/dL
NITRITE: NEGATIVE
PH: 5.5 (ref 5.0–8.0)
Protein, ur: NEGATIVE mg/dL
SPECIFIC GRAVITY, URINE: 1.015 (ref 1.005–1.030)
Urobilinogen, UA: 0.2 mg/dL (ref 0.0–1.0)

## 2014-01-14 LAB — URINE MICROSCOPIC-ADD ON

## 2014-01-14 LAB — POC URINE PREG, ED: PREG TEST UR: NEGATIVE

## 2014-01-14 MED ORDER — CEPHALEXIN 500 MG PO CAPS: 500.0000 mg | ORAL_CAPSULE | Freq: Two times a day (BID) | ORAL | Status: DC

## 2014-01-14 NOTE — Discharge Instructions (Signed)
Urinary Tract Infection  Urinary tract infections (UTIs) can develop anywhere along your urinary tract. Your urinary tract is your body's drainage system for removing wastes and extra water. Your urinary tract includes two kidneys, two ureters, a bladder, and a urethra. Your kidneys are a pair of bean-shaped organs. Each kidney is about the size of your fist. They are located below your ribs, one on each side of your spine.  CAUSES  Infections are caused by microbes, which are microscopic organisms, including fungi, viruses, and bacteria. These organisms are so small that they can only be seen through a microscope. Bacteria are the microbes that most commonly cause UTIs.  SYMPTOMS   Symptoms of UTIs may vary by age and gender of the patient and by the location of the infection. Symptoms in young women typically include a frequent and intense urge to urinate and a painful, burning feeling in the bladder or urethra during urination. Older women and men are more likely to be tired, shaky, and weak and have muscle aches and abdominal pain. A fever may mean the infection is in your kidneys. Other symptoms of a kidney infection include pain in your back or sides below the ribs, nausea, and vomiting.  DIAGNOSIS  To diagnose a UTI, your caregiver will ask you about your symptoms. Your caregiver also will ask to provide a urine sample. The urine sample will be tested for bacteria and white blood cells. White blood cells are made by your body to help fight infection.  TREATMENT   Typically, UTIs can be treated with medication. Because most UTIs are caused by a bacterial infection, they usually can be treated with the use of antibiotics. The choice of antibiotic and length of treatment depend on your symptoms and the type of bacteria causing your infection.  HOME CARE INSTRUCTIONS   If you were prescribed antibiotics, take them exactly as your caregiver instructs you. Finish the medication even if you feel better after you  have only taken some of the medication.   Drink enough water and fluids to keep your urine clear or pale yellow.   Avoid caffeine, tea, and carbonated beverages. They tend to irritate your bladder.   Empty your bladder often. Avoid holding urine for long periods of time.   Empty your bladder before and after sexual intercourse.   After a bowel movement, women should cleanse from front to back. Use each tissue only once.  SEEK MEDICAL CARE IF:    You have back pain.   You develop a fever.   Your symptoms do not begin to resolve within 3 days.  SEEK IMMEDIATE MEDICAL CARE IF:    You have severe back pain or lower abdominal pain.   You develop chills.   You have nausea or vomiting.   You have continued burning or discomfort with urination.  MAKE SURE YOU:    Understand these instructions.   Will watch your condition.   Will get help right away if you are not doing well or get worse.  Document Released: 04/28/2005 Document Revised: 01/18/2012 Document Reviewed: 08/27/2011  [employer] Patient Information 2014 [employer].

## 2014-01-14 NOTE — ED Provider Notes (Signed)
CSN: [id number]     Arrival date & time 01/14/14  0240 History   First MD Initiated Contact with Patient 01/14/14 0321     Chief Complaint  Patient presents with  . Dysuria    (Consider location/radiation/quality/duration/timing/severity/associated sxs/prior Treatment) Patient is a 23 y.o. female presenting with dysuria. The history is provided by the patient. No language interpreter was used.  Dysuria Pain quality:  Burning Pain severity:  Moderate Onset quality:  Gradual Duration:  1 week Timing: when urinating only. Progression:  Worsening Chronicity:  New Relieved by:  Nothing Exacerbated by: urinating. Urinary symptoms: no discolored urine, no hematuria and no bladder incontinence   Associated symptoms: abdominal pain (suprapubic)   Associated symptoms: no fever, no flank pain, no nausea, no vaginal discharge and no vomiting     Past Medical History  Diagnosis Date  . Chlamydia infection   . Gonorrhea   . H/O varicella   . Hx: UTI (urinary tract infection)    Past Surgical History  Procedure Laterality Date  . Wisdom tooth extraction     Family History  Problem Relation Age of Onset  . Anesthesia problems Neg Hx   . Hypertension Mother   . Arthritis Mother   . Thyroid disease Mother   . Asthma Brother   . Thalassemia Brother   . Alcohol abuse Father   . Diabetes Maternal Aunt    History  Substance Use Topics  . Smoking status: Never Smoker   . Smokeless tobacco: Never Used  . Alcohol Use: No   OB History   Grav Para Term Preterm Abortions TAB SAB Ect Mult Living   1 1 1  0 0 0 0 0 0 1      Review of Systems  Constitutional: Negative for fever.  Gastrointestinal: Positive for abdominal pain (suprapubic). Negative for nausea and vomiting.  Genitourinary: Positive for dysuria. Negative for hematuria, flank pain and vaginal discharge.  All other systems reviewed and are negative.    Allergies  Blueberry  Home Medications   Prior to Admission  medications   Medication Sig Start Date End Date Taking? Authorizing Provider  ibuprofen (ADVIL,MOTRIN) 200 MG tablet Take 200 mg by mouth every 6 (six) hours as needed for pain.    Yes Historical Provider, MD  cephALEXin (KEFLEX) 500 MG capsule Take 1 capsule (500 mg total) by mouth 2 (two) times daily. 01/14/14   Antony MaduraKelly Jerusalem Wert, PA-C  etonogestrel (NEXPLANON) 68 MG IMPL implant Inject 1 each into the skin once. 2013    Historical Provider, MD   BP 130/76  Pulse 66  Temp(Src) 98.4 F (36.9 C) (Oral)  Resp 16  SpO2 100%  LMP 12/14/2013  Physical Exam  Nursing note and vitals reviewed. Constitutional: She is oriented to person, place, and time. She appears well-developed and well-nourished. No distress.  Nontoxic/nonseptic appearing  HENT:  Head: Normocephalic and atraumatic.  Eyes: Conjunctivae and EOM are normal. No scleral icterus.  Neck: Normal range of motion.  Cardiovascular: Normal rate, regular rhythm and intact distal pulses.   Pulmonary/Chest: Effort normal. No respiratory distress. She has no wheezes.  Abdominal: Soft. She exhibits no distension. There is no tenderness. There is no rebound and no guarding.  Soft and nontender. No masses. No CVA tenderness.  Musculoskeletal: Normal range of motion.  Neurological: She is alert and oriented to person, place, and time.  Skin: Skin is warm and dry. No rash noted. She is not diaphoretic. No erythema. No pallor.  Psychiatric: She has a normal  mood and affect. Her behavior is normal.    ED Course  Procedures (including critical care time) Labs Review Labs Reviewed  URINALYSIS, ROUTINE W REFLEX MICROSCOPIC - Abnormal; Notable for the following:    APPearance CLOUDY (*)    Leukocytes, UA MODERATE (*)    All other components within normal limits  URINE MICROSCOPIC-ADD ON - Abnormal; Notable for the following:    Squamous Epithelial / LPF FEW (*)    Bacteria, UA FEW (*)    All other components within normal limits  URINE CULTURE   POC URINE PREG, ED    Imaging Review No results found.   EKG Interpretation None      MDM   Final diagnoses:  UTI (urinary tract infection)    Pt with dysuria x 1 week. She has been diagnosed with a UTI. Pt is afebrile, no CVA tenderness, normotensive, and denies N/V. Pt to be dc home with antibiotics and instructions to follow up with PCP if symptoms persist. Patient agreeable to plan with no unaddressed concerns.  Filed Vitals:   01/14/14 0258  BP: 130/76  Pulse: 66  Temp: 98.4 F (36.9 C)  TempSrc: Oral  Resp: 16  SpO2: 100%       Antony MaduraKelly Dulce Martian, PA-C 01/14/14 0507

## 2014-01-14 NOTE — ED Notes (Signed)
Pt arrived to the ED with a complaint of painful urination.  Pt has been experiencing this for a week.  Pt states the sensation is a burning.  Pt states that she also has frequent urges to urinate.

## 2014-01-14 NOTE — ED Provider Notes (Signed)
Medical screening examination/treatment/procedure(s) were performed by non-physician practitioner and as supervising physician I was immediately available for consultation/collaboration.   EKG Interpretation None       Kaisen Ackers M Glennie Rodda, MD 01/14/14 0823 

## 2014-01-16 LAB — URINE CULTURE: Special Requests: NORMAL

## 2014-01-17 ENCOUNTER — Telehealth (HOSPITAL_BASED_OUTPATIENT_CLINIC_OR_DEPARTMENT_OTHER): Payer: Self-pay | Admitting: Emergency Medicine

## 2014-01-17 NOTE — Telephone Encounter (Signed)
Post ED Visit - Positive Culture Follow-up  Culture report reviewed by antimicrobial stewardship pharmacist: []  Wes Dulaney, Pharm.D., BCPS []  Celedonio MiyamotoJeremy Frens, Pharm.D., BCPS []  Georgina PillionElizabeth Martin, Pharm.D., BCPS []  HenriettaMinh Pham, [address] Rainbow BoulevardPharm.D., BCPS, AAHIVP []  Estella HuskMichelle Turner, Pharm.D., BCPS, AAHIVP []  Harvie JuniorNathan Cope, Pharm.D. [x]  Joyice FasterJonathan Binz, Pharm.D.  Positive urine culture Treated with Keflex, organism sensitive to the same and no further patient follow-up is required at this time.  [place], Jenel LucksKylie 01/17/2014, 6:08 PM

## 2014-02-21 IMAGING — CR DG CHEST 2V
2 series · 2 of 2 positions shown · non-contrast
Comparison: None.

CLINICAL DATA: Pain post trauma

CHEST - 2 VIEW

[w chest pa]
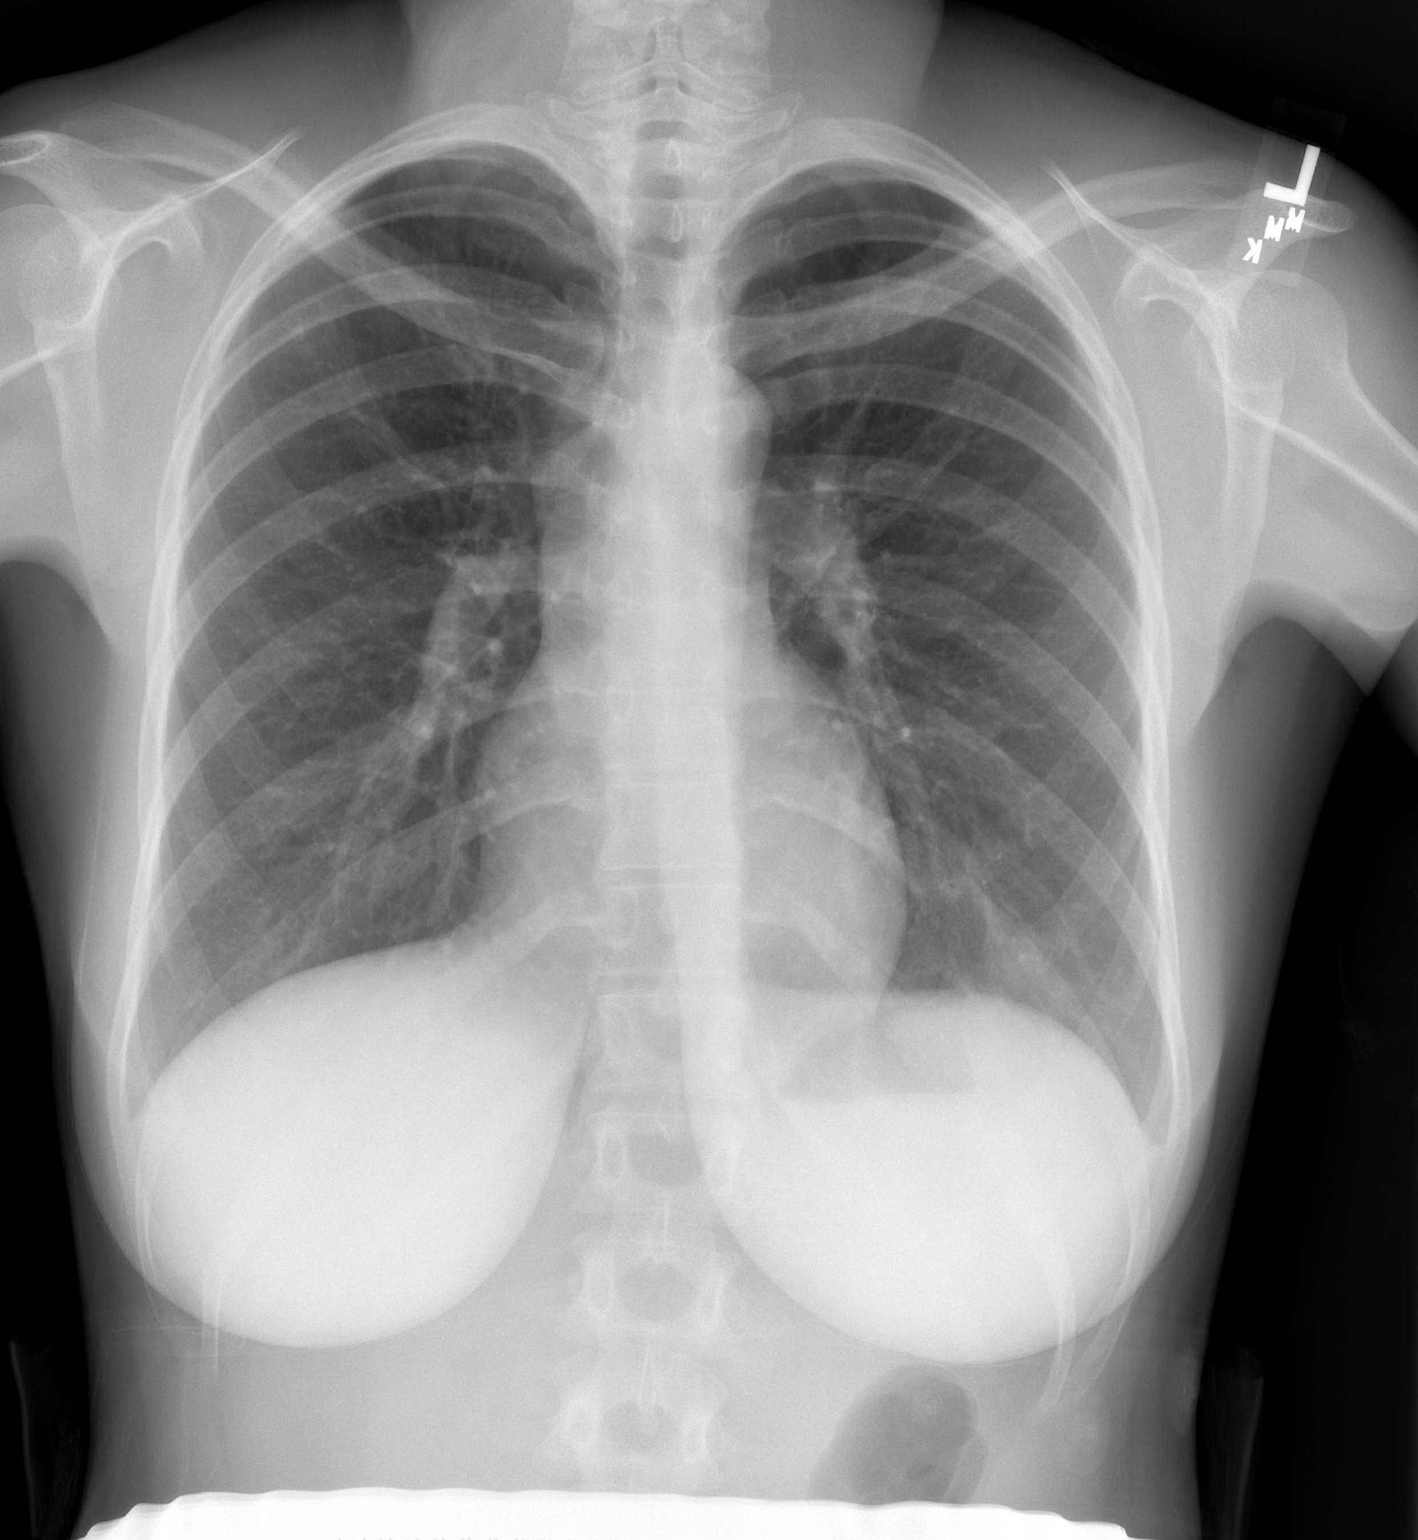

[w chest lat]
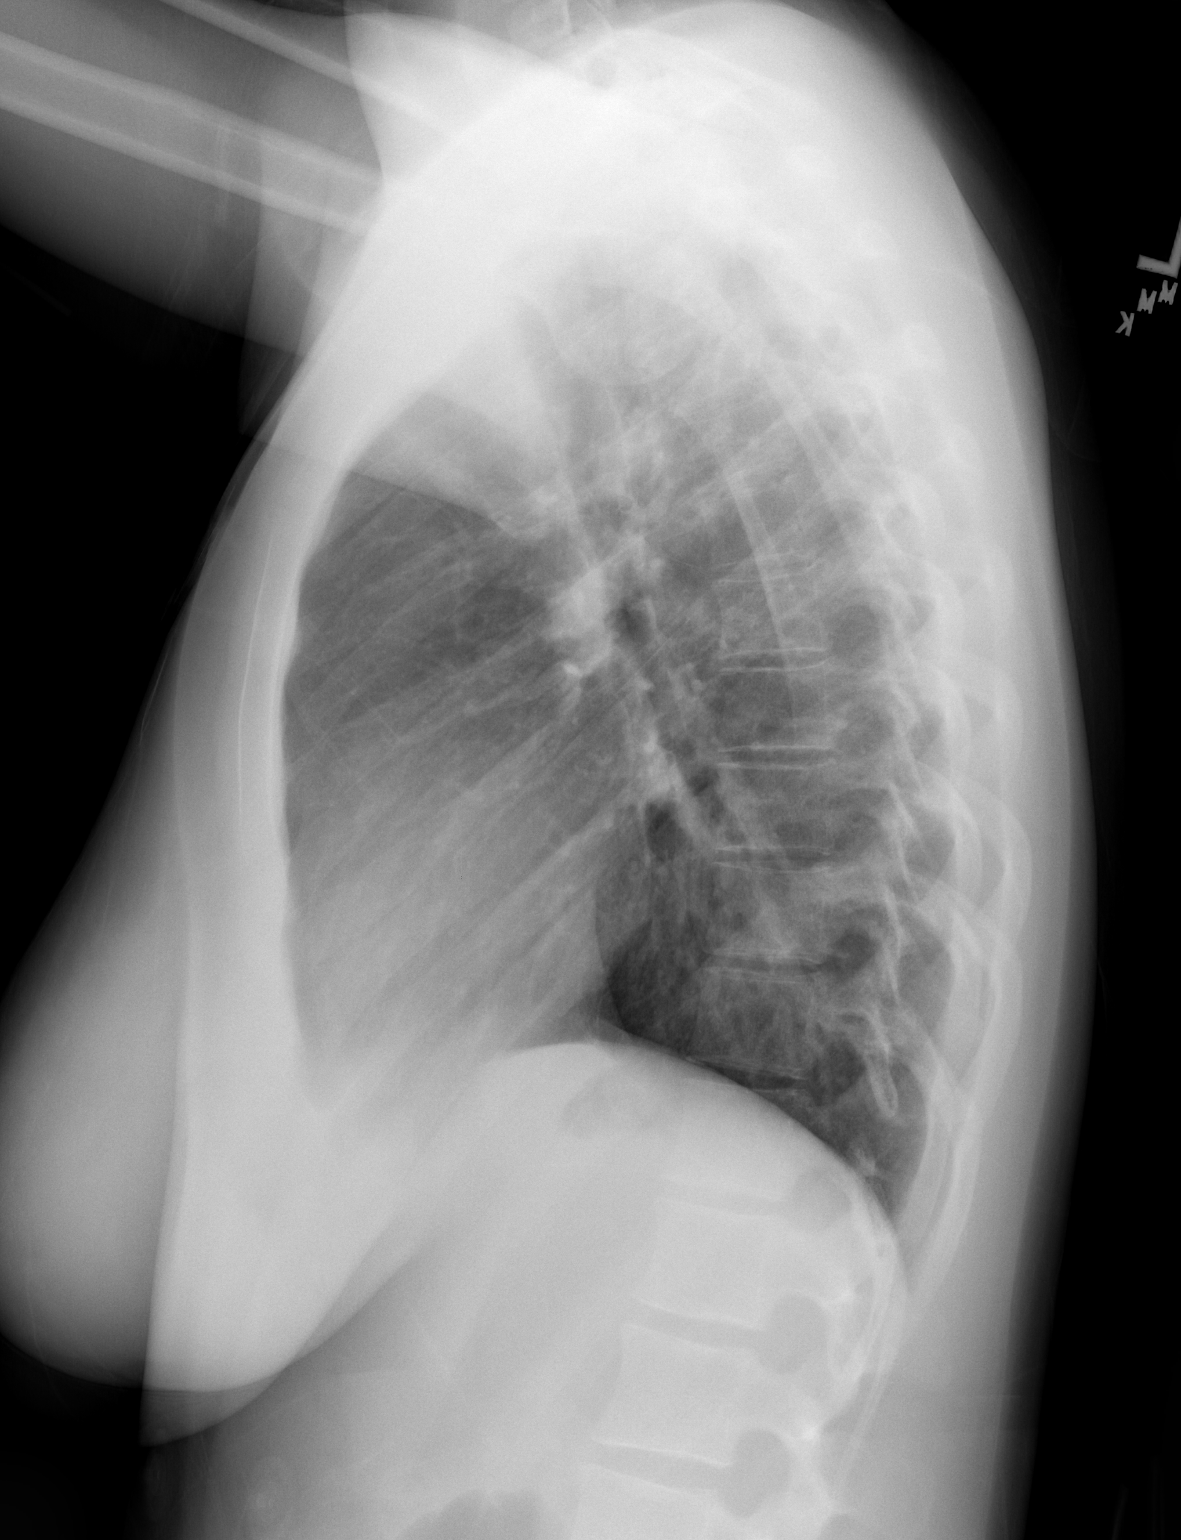

[2 of 2 positions shown; findings below may reference images not displayed]

FINDINGS: There is minimal increased opacity in the left base.
This area may represent minimal infiltrate or possibly minimal
contusion.  Elsewhere lungs clear.  No pneumothorax.  Heart size
and pulmonary vascularity are normal.  No bony abnormality
appreciable.
IMPRESSION: The patient an area of minimal infiltrate versus
contusion left base.  Lungs otherwise clear.  No pneumothorax.

## 2014-02-21 IMAGING — CR DG KNEE COMPLETE 4+V*R*
4 series · 4 of 4 positions shown · non-contrast
Comparison: None.

CLINICAL DATA: Pain post trauma

RIGHT KNEE - COMPLETE 4+ VIEW

[t knee ap right]
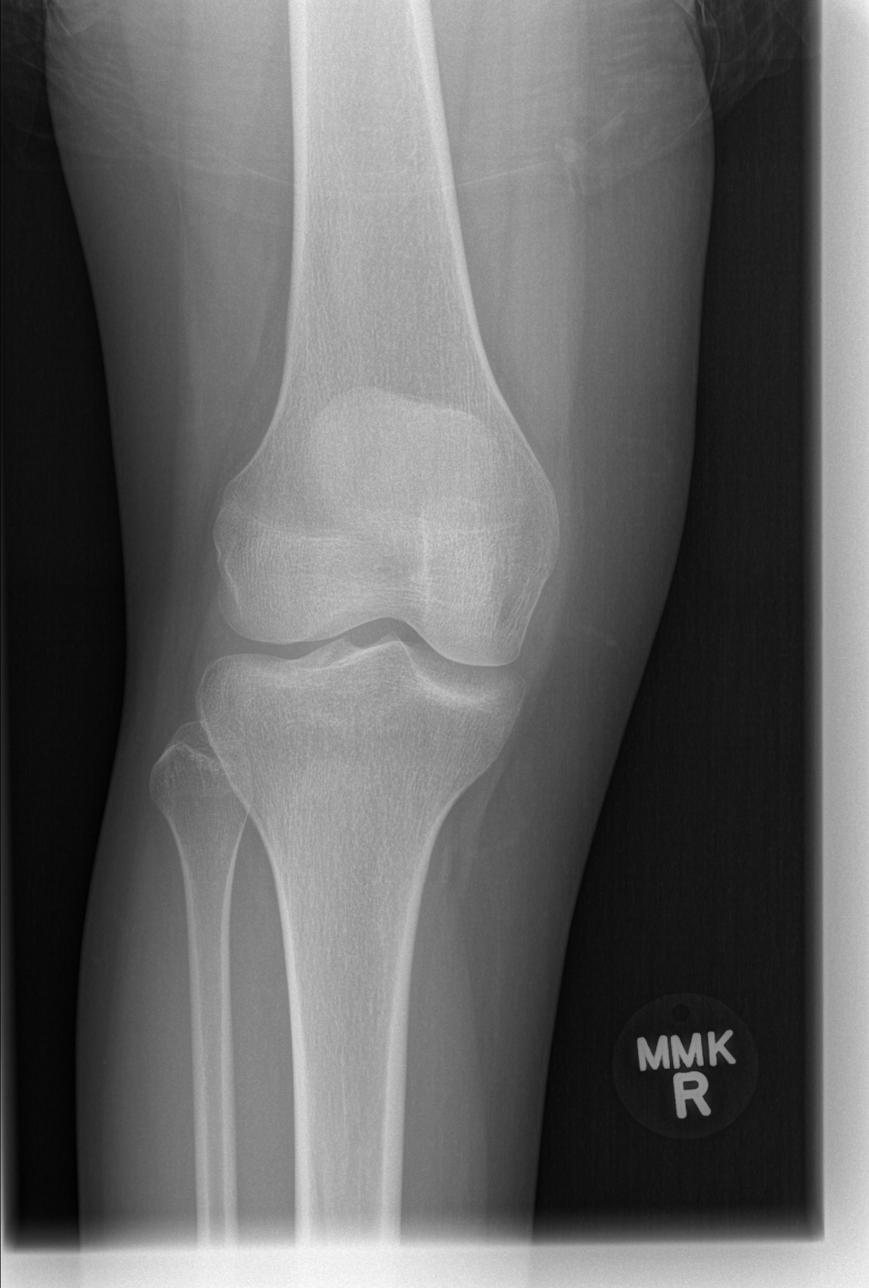

[t knee oblique right (1 of 2)]
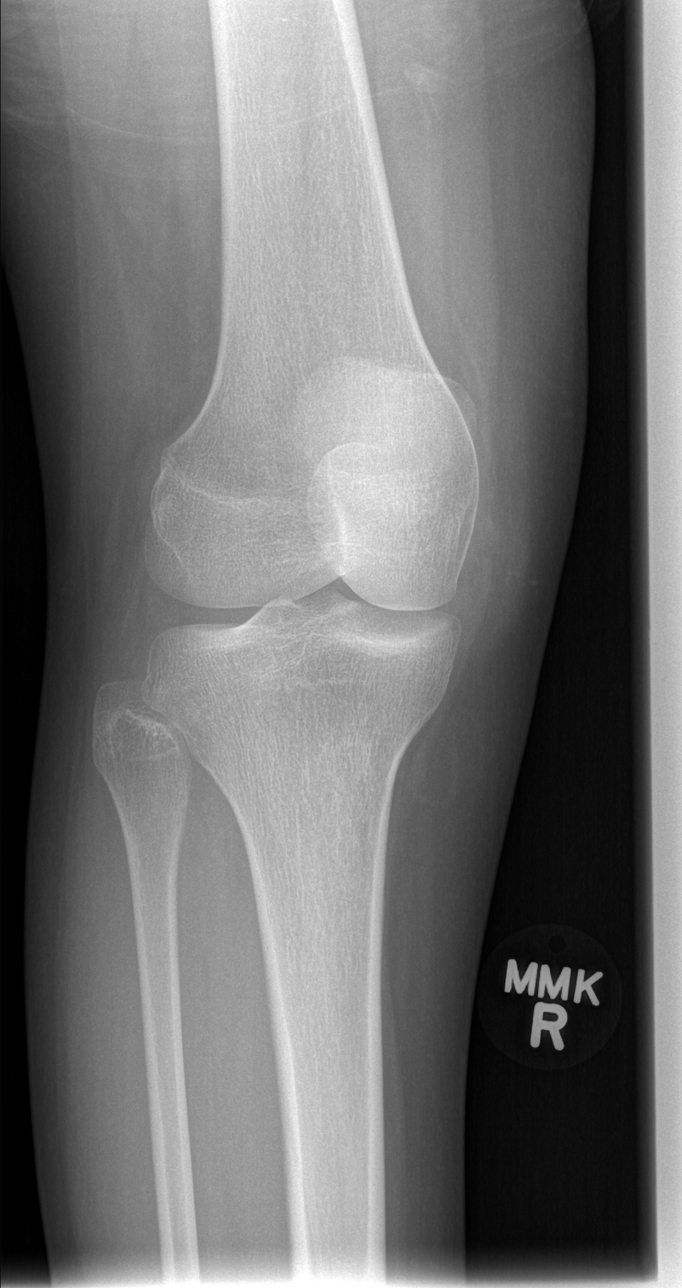

[t knee oblique right (2 of 2)]
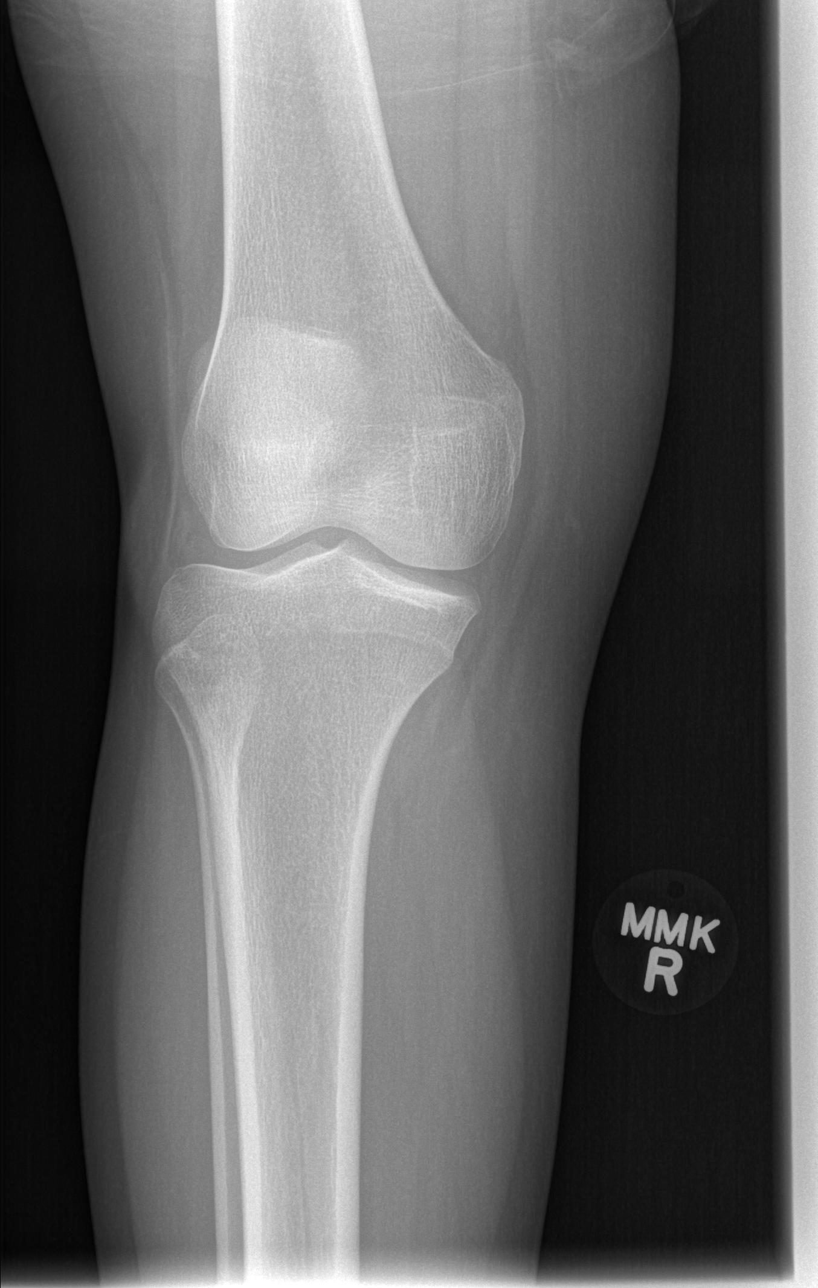

[t knee lat right]
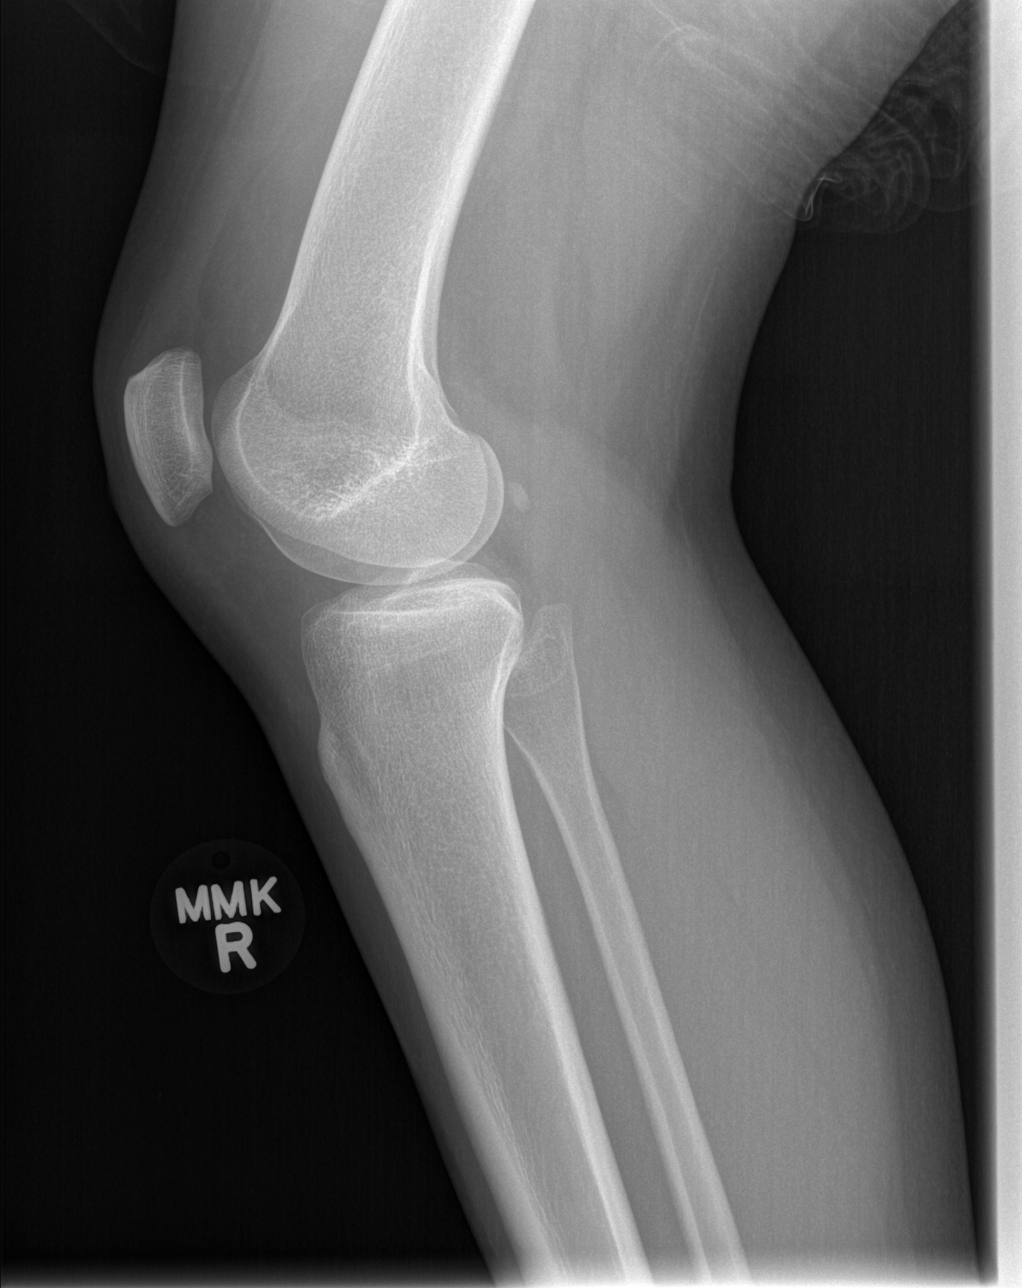

[4 of 4 positions shown; findings below may reference images not displayed]

FINDINGS: Frontal, lateral, and bilateral oblique views were
obtained.  No fracture, dislocation, or effusion.  Joint spaces
appear intact.  No erosive change.
IMPRESSION: No abnormality noted.

## 2014-06-03 ENCOUNTER — Encounter (HOSPITAL_COMMUNITY): Payer: Self-pay | Admitting: Emergency Medicine

## 2014-11-02 ENCOUNTER — Encounter (HOSPITAL_COMMUNITY): Payer: Self-pay | Admitting: Emergency Medicine

## 2014-11-02 ENCOUNTER — Emergency Department (HOSPITAL_COMMUNITY)
Admission: EM | Admit: 2014-11-02 | Discharge: 2014-11-02 | Discharge status: Against Medical Advice | Payer: Self-pay | Attending: Emergency Medicine | Admitting: Emergency Medicine

## 2014-11-02 DIAGNOSIS — N898 Other specified noninflammatory disorders of vagina: Secondary | ICD-10-CM | POA: Insufficient documentation

## 2014-11-02 NOTE — ED Notes (Signed)
Pt reports using nair in vaginal area yesterday and is having vaginal burning.

## 2014-12-18 ENCOUNTER — Emergency Department (HOSPITAL_COMMUNITY)
Admission: EM | Admit: 2014-12-18 | Discharge: 2014-12-18 | Disposition: A | Discharge status: Home / Self Care | Payer: Self-pay | Attending: Emergency Medicine | Admitting: Emergency Medicine

## 2014-12-18 ENCOUNTER — Encounter (HOSPITAL_COMMUNITY): Payer: Self-pay | Admitting: Emergency Medicine

## 2014-12-18 DIAGNOSIS — L0390 Cellulitis, unspecified: Secondary | ICD-10-CM

## 2014-12-18 DIAGNOSIS — Z23 Encounter for immunization: Secondary | ICD-10-CM | POA: Insufficient documentation

## 2014-12-18 DIAGNOSIS — Z8619 Personal history of other infectious and parasitic diseases: Secondary | ICD-10-CM | POA: Insufficient documentation

## 2014-12-18 DIAGNOSIS — L0291 Cutaneous abscess, unspecified: Secondary | ICD-10-CM

## 2014-12-18 DIAGNOSIS — Z87440 Personal history of urinary (tract) infections: Secondary | ICD-10-CM | POA: Insufficient documentation

## 2014-12-18 DIAGNOSIS — L03113 Cellulitis of right upper limb: Secondary | ICD-10-CM | POA: Insufficient documentation

## 2014-12-18 MED ORDER — TETANUS-DIPHTH-ACELL PERTUSSIS 5-2.5-18.5 LF-MCG/0.5 IM SUSP
0.5000 mL | Freq: Once | INTRAMUSCULAR | Status: AC
  Administered 2014-12-18: 0.5 mL via INTRAMUSCULAR
  Filled 2014-12-18: qty 0.5

## 2014-12-18 MED ORDER — HYDROCODONE-ACETAMINOPHEN 5-325 MG PO TABS
1.0000 | ORAL_TABLET | Freq: Once | ORAL | Status: AC
  Administered 2014-12-18: 1 via ORAL
  Filled 2014-12-18: qty 1

## 2014-12-18 MED ORDER — CEPHALEXIN 500 MG PO CAPS: 500.0000 mg | ORAL_CAPSULE | Freq: Four times a day (QID) | ORAL | Status: DC

## 2014-12-18 MED ORDER — HYDROCODONE-ACETAMINOPHEN 5-325 MG PO TABS
1.0000 | ORAL_TABLET | ORAL | Status: DC | PRN
Start: 2014-12-18 — End: 2017-10-18

## 2014-12-18 MED ORDER — SULFAMETHOXAZOLE-TRIMETHOPRIM 800-160 MG PO TABS: 1.0000 | ORAL_TABLET | Freq: Two times a day (BID) | ORAL | Status: AC

## 2014-12-18 MED ORDER — LIDOCAINE HCL (PF) 1 % IJ SOLN
5.0000 mL | Freq: Once | INTRAMUSCULAR | Status: AC
  Administered 2014-12-18: 5 mL via INTRADERMAL
  Filled 2014-12-18: qty 5

## 2014-12-18 NOTE — ED Notes (Signed)
Pt has a large boil to right elbow. The area is inflamed and warm to touch.

## 2014-12-18 NOTE — Discharge Instructions (Signed)
Please follow the directions provided. Be sure to return in 2 days for a wound recheck. Use warm soapy water soaks at home to encourage continued drainage from your skin. He may use a warm compress 5 times a day, 5-10 minutes each time help soften the skin. Take both antibiotics as directed until it's all gone. You may use the Vicodin to help with pain. Don't hesitate to return for any new, worsening, or concerning symptoms.    SEEK IMMEDIATE MEDICAL CARE IF:  You feel very sleepy.  You develop vomiting or diarrhea.  You have a general ill feeling (malaise) with muscle aches and pains.

## 2014-12-18 NOTE — ED Provider Notes (Signed)
CSN: [id number]     Arrival date & time 12/18/14  0134 History   First MD Initiated Contact with Patient 12/18/14 0145     Chief Complaint  Patient presents with  . Skin Problem   (Consider location/radiation/quality/duration/timing/severity/associated sxs/prior Treatment) HPI  Tracy Haynes is a 24 year old female complaining of infection skin above her right elbow. She states 7 days ago, she noticed a small abscess. Her boyfriend lanced it with a safety pin that had been cleaned with peroxide. The abscess drained white pus. Two days later the abscess returned and was lanced again in the same manner. 2 days ago she began to have redness and swelling to the distal and upper right upper arm. She denies fevers, chills, decreased range of motion or pain in her joint.  Past Medical History  Diagnosis Date  . Chlamydia infection   . Gonorrhea   . H/O varicella   . Hx: UTI (urinary tract infection)    Past Surgical History  Procedure Laterality Date  . Wisdom tooth extraction     Family History  Problem Relation Age of Onset  . Anesthesia problems Neg Hx   . Hypertension Mother   . Arthritis Mother   . Thyroid disease Mother   . Asthma Brother   . Thalassemia Brother   . Alcohol abuse Father   . Diabetes Maternal Aunt    History  Substance Use Topics  . Smoking status: Never Smoker   . Smokeless tobacco: Never Used  . Alcohol Use: Yes   OB History    Gravida Para Term Preterm AB TAB SAB Ectopic Multiple Living   1 1 1  0 0 0 0 0 0 1     Review of Systems  Constitutional: Negative for fever and chills.  Musculoskeletal: Negative for arthralgias.  Skin: Positive for rash.      Allergies  Blueberry  Home Medications   Prior to Admission medications   Medication Sig Start Date End Date Taking? Authorizing Provider  etonogestrel (NEXPLANON) 68 MG IMPL implant Inject 1 each into the skin once. 2013   Yes Historical Provider, MD  ibuprofen (ADVIL,MOTRIN) 200 MG tablet  Take 400 mg by mouth every 6 (six) hours as needed (for pain.).    Yes Historical Provider, MD  cephALEXin (KEFLEX) 500 MG capsule Take 1 capsule (500 mg total) by mouth 2 (two) times daily. Patient not taking: Reported on 12/18/2014 01/14/14   Antony MaduraKelly Humes, PA-C   BP 140/89 mmHg  Pulse 77  Temp(Src) 98 F (36.7 C) (Oral)  Resp 18  SpO2 100%  LMP 12/13/2014 Physical Exam  Constitutional: She appears well-developed and well-nourished. No distress.  HENT:  Head: Normocephalic and atraumatic.  Eyes: Conjunctivae are normal.  Neck: Neck supple.  Cardiovascular: Normal rate, regular rhythm and intact distal pulses.   Pulmonary/Chest: Effort normal and breath sounds normal. No respiratory distress.  Neurological: She is alert.  Skin: Skin is warm and dry. Rash noted. She is not diaphoretic.     3 cm diameter area of redness and induration with central fluctuance.  Psychiatric: She has a normal mood and affect.  Nursing note and vitals reviewed.   ED Course  Procedures (including critical care time) EMERGENCY DEPARTMENT US SOFT TISSUE INTERPRETATION "Study: Limited Ultrasound of the noted body part in comments below"  INDICATIONS: Pain and Soft tissue infection Multiple views of the body part are obtained with a multi-frequency linear probe  PERFORMED BY:  Myself  IMAGES ARCHIVED?: Yes  SIDE:Right   BODY  PART:Upper extremity  FINDINGS: Abcess present and Cellulitis present  LIMITATIONS:  N/A  INTERPRETATION:  Abcess present and Cellulitis present  INCISION AND DRAINAGE Performed by: Harle Battiestysinger, Cletus Paris Consent: Verbal consent obtained. Risks and benefits: risks, benefits and alternatives were discussed Type: abscess  Body area: right posterior, distal upper arm  Anesthesia: local infiltration  Incision was made with a scalpel.  Local anesthetic: lidocaine 1% without epinephrine  Anesthetic total: 2 ml  Complexity: complex Blunt dissection to break up  loculations  Drainage: purulent  Drainage amount: small  Packing material: 1/4 in iodoform gauze  Patient tolerance: Patient tolerated the procedure well with no immediate complications.     Labs Review Labs Reviewed - No data to display  Imaging Review No results found.   EKG Interpretation None      MDM   Final diagnoses:  Abscess and cellulitis   24 yo  with cellulitis and skin abscess amenable to incision and drainage.  Abscess was not large enough to warrant packing but small amount of purulent drainage noted. Discussed wound recheck in 2 days and home warm soaks and flushing.  Prescription for antibiotics provided. Pt is well-appearing, in no acute distress and vital signs reviewed and not concerning. She appears safe to be discharged.  Discharge include follow-up with their PCP.  Return precautions provided.  She is aware of plan and in agreement.   Filed Vitals:   12/18/14 0138 12/18/14 0305  BP: 140/89 133/80  Pulse: 77 80  Temp: 98 F (36.7 C) 97.9 F (36.6 C)  TempSrc: Oral Oral  Resp: 18 20  SpO2: 100% 98%   Meds given in ED:  Medications  HYDROcodone-acetaminophen (NORCO/VICODIN) 5-325 MG per tablet 1 tablet (1 tablet Oral Given 12/18/14 0240)  lidocaine (PF) (XYLOCAINE) 1 % injection 5 mL (5 mLs Intradermal Given by Other 12/18/14 0241)  Tdap (BOOSTRIX) injection 0.5 mL (0.5 mLs Intramuscular Given 12/18/14 0241)    Discharge Medication List as of 12/18/2014  3:02 AM    START taking these medications   Details  HYDROcodone-acetaminophen (NORCO/VICODIN) 5-325 MG per tablet Take 1 tablet by mouth every 4 (four) hours as needed., Starting 12/18/2014, Until Discontinued, Print    sulfamethoxazole-trimethoprim (BACTRIM DS,SEPTRA DS) 800-160 MG per tablet Take 1 tablet by mouth 2 (two) times daily., Starting 12/18/2014, Until Wed 12/25/14, Print       12/18/14 0000  sulfamethoxazole-trimethoprim (BACTRIM DS,SEPTRA DS) 800-160 MG per tablet 2 times daily  Discontinue Reprint 12/18/14 0300   12/18/14 0000  HYDROcodone-acetaminophen (NORCO/VICODIN) 5-325 MG per tablet Every 4 hours PRN Discontinue Reprint 12/18/14 0300   12/18/14 0000  cephALEXin (KEFLEX) 500 MG capsule 4 times daily Discontinue Reprint 12/18/14 0300          Harle BattiestElizabeth Lizzett Nobile, NP 12/18/14 1701  Marisa Severinlga Otter, MD 12/20/14 1657

## 2014-12-18 NOTE — ED Notes (Signed)
Pt states that she has an area that is possibly infected on her R elbow area that has been getting worse over the past 3 days. Alert and oriented.

## 2014-12-18 NOTE — ED Notes (Signed)
Using clean technique, applied telfa pad over the I&D site and secured with surgical tape. Pt tolerated it well.

## 2014-12-20 ENCOUNTER — Encounter (HOSPITAL_COMMUNITY): Payer: Self-pay | Admitting: Emergency Medicine

## 2014-12-20 ENCOUNTER — Emergency Department (HOSPITAL_COMMUNITY)
Admission: EM | Admit: 2014-12-20 | Discharge: 2014-12-20 | Disposition: A | Discharge status: Home / Self Care | Payer: Self-pay | Attending: Emergency Medicine | Admitting: Emergency Medicine

## 2014-12-20 DIAGNOSIS — Z792 Long term (current) use of antibiotics: Secondary | ICD-10-CM | POA: Insufficient documentation

## 2014-12-20 DIAGNOSIS — Z09 Encounter for follow-up examination after completed treatment for conditions other than malignant neoplasm: Secondary | ICD-10-CM

## 2014-12-20 DIAGNOSIS — Z8619 Personal history of other infectious and parasitic diseases: Secondary | ICD-10-CM | POA: Insufficient documentation

## 2014-12-20 DIAGNOSIS — Z87440 Personal history of urinary (tract) infections: Secondary | ICD-10-CM | POA: Insufficient documentation

## 2014-12-20 DIAGNOSIS — Z4801 Encounter for change or removal of surgical wound dressing: Secondary | ICD-10-CM | POA: Insufficient documentation

## 2014-12-20 NOTE — ED Provider Notes (Signed)
CSN: [id number]     Arrival date & time 12/20/14  1854 History  This chart was scribed for non-physician practitioner working, Elpidio AnisShari Amisadai Woodford, PA-C, with Lorre NickAnthony Allen, MD, by Modena JanskyAlbert Thayil, ED Scribe. This patient was seen in room WTR5/WTR5 and the patient's care was started at 8:11 PM.   Chief Complaint  Patient presents with  . Wound Check   The history is provided by the patient. No language interpreter was used.   HPI Comments: Tracy Haynes is a 24 y.o. female who presents to the Emergency Department complaining of a need for a wound recheck. She reports that she came to the ED today for an advised check of a drained abscess on her right elbow from 2 days ago. She states that she is currently on antibiotics for the abscess. She reports that she has been having improvement. She denies any drainage or pain.   Past Medical History  Diagnosis Date  . Chlamydia infection   . Gonorrhea   . H/O varicella   . Hx: UTI (urinary tract infection)    Past Surgical History  Procedure Laterality Date  . Wisdom tooth extraction     Family History  Problem Relation Age of Onset  . Anesthesia problems Neg Hx   . Hypertension Mother   . Arthritis Mother   . Thyroid disease Mother   . Asthma Brother   . Thalassemia Brother   . Alcohol abuse Father   . Diabetes Maternal Aunt    History  Substance Use Topics  . Smoking status: Never Smoker   . Smokeless tobacco: Never Used  . Alcohol Use: Yes   OB History    Gravida Para Term Preterm AB TAB SAB Ectopic Multiple Living   1 1 1  0 0 0 0 0 0 1     Review of Systems  Skin: Positive for wound.    Allergies  Blueberry  Home Medications   Prior to Admission medications   Medication Sig Start Date End Date Taking? Authorizing Provider  cephALEXin (KEFLEX) 500 MG capsule Take 1 capsule (500 mg total) by mouth 4 (four) times daily. 12/18/14  Yes Harle BattiestElizabeth Tysinger, NP  HYDROcodone-acetaminophen (NORCO/VICODIN) 5-325 MG per tablet Take 1  tablet by mouth every 4 (four) hours as needed. 12/18/14  Yes Harle BattiestElizabeth Tysinger, NP  ibuprofen (ADVIL,MOTRIN) 200 MG tablet Take 400 mg by mouth every 6 (six) hours as needed for moderate pain (pain).    Yes Historical Provider, MD  sulfamethoxazole-trimethoprim (BACTRIM DS,SEPTRA DS) 800-160 MG per tablet Take 1 tablet by mouth 2 (two) times daily. 12/18/14 12/25/14 Yes Harle BattiestElizabeth Tysinger, NP  etonogestrel (NEXPLANON) 68 MG IMPL implant Inject 1 each into the skin once. 2013    Historical Provider, MD   BP 109/70 mmHg  Pulse 84  Temp(Src) 98 F (36.7 C) (Oral)  Resp 16  SpO2 99%  LMP 12/13/2014 Physical Exam  Constitutional: She is oriented to person, place, and time. She appears well-developed and well-nourished. No distress.  HENT:  Head: Normocephalic and atraumatic.  Neck: Neck supple. No tracheal deviation present.  Cardiovascular: Normal rate.   Pulmonary/Chest: Effort normal. No respiratory distress.  Musculoskeletal: Normal range of motion.  Neurological: She is alert and oriented to person, place, and time.  Skin: Skin is warm and dry.  Healing I&D site of abscess right posterior elbow. No redness, warmth or tenderness. No swelling.   Psychiatric: She has a normal mood and affect. Her behavior is normal.  Nursing note and vitals reviewed.  ED Course  Procedures (including critical care time) DIAGNOSTIC STUDIES: Oxygen Saturation is 99% on RA, normal by my interpretation.    COORDINATION OF CARE: 8:15 PM- Pt advised of plan for treatment and pt agrees.  Labs Review Labs Reviewed - No data to display  Imaging Review No results found.   EKG Interpretation None      MDM   Final diagnoses:  None    1. Wound recheck  Wound is healing nicely, no surrounding erythema to suggest persistent infection and area is nontender.   I personally performed the services described in this documentation, which was scribed in my presence. The recorded information has been  reviewed and is accurate.      Elpidio AnisShari Cherokee Boccio, PA-C 12/21/14 2300  Lorre NickAnthony Allen, MD 12/24/14 0730

## 2014-12-20 NOTE — ED Notes (Signed)
Pt alert,oriented, and ambulatory upon DC. 

## 2014-12-20 NOTE — ED Notes (Signed)
Pt here for re-check of abscess on R elbow. Pt denies pain, fever.

## 2014-12-20 NOTE — Discharge Instructions (Signed)

## 2015-01-05 ENCOUNTER — Emergency Department (HOSPITAL_COMMUNITY)
Admission: EM | Admit: 2015-01-05 | Discharge: 2015-01-05 | Disposition: A | Discharge status: Home / Self Care | Payer: Self-pay | Attending: Emergency Medicine | Admitting: Emergency Medicine

## 2015-01-05 ENCOUNTER — Encounter (HOSPITAL_COMMUNITY): Payer: Self-pay | Admitting: *Deleted

## 2015-01-05 DIAGNOSIS — Z3202 Encounter for pregnancy test, result negative: Secondary | ICD-10-CM | POA: Insufficient documentation

## 2015-01-05 DIAGNOSIS — Z79899 Other long term (current) drug therapy: Secondary | ICD-10-CM | POA: Insufficient documentation

## 2015-01-05 DIAGNOSIS — Z8619 Personal history of other infectious and parasitic diseases: Secondary | ICD-10-CM | POA: Insufficient documentation

## 2015-01-05 DIAGNOSIS — R102 Pelvic and perineal pain: Secondary | ICD-10-CM | POA: Insufficient documentation

## 2015-01-05 DIAGNOSIS — Z87440 Personal history of urinary (tract) infections: Secondary | ICD-10-CM | POA: Insufficient documentation

## 2015-01-05 LAB — URINE MICROSCOPIC-ADD ON

## 2015-01-05 LAB — WET PREP, GENITAL
Clue Cells Wet Prep HPF POC: NONE SEEN
Trich, Wet Prep: NONE SEEN
Yeast Wet Prep HPF POC: NONE SEEN

## 2015-01-05 LAB — URINALYSIS, ROUTINE W REFLEX MICROSCOPIC
BILIRUBIN URINE: NEGATIVE
Glucose, UA: NEGATIVE mg/dL
Hgb urine dipstick: NEGATIVE
Ketones, ur: NEGATIVE mg/dL
NITRITE: NEGATIVE
PH: 5.5 (ref 5.0–8.0)
Protein, ur: NEGATIVE mg/dL
SPECIFIC GRAVITY, URINE: 1.028 (ref 1.005–1.030)
Urobilinogen, UA: 0.2 mg/dL (ref 0.0–1.0)

## 2015-01-05 LAB — POC URINE PREG, ED: PREG TEST UR: NEGATIVE

## 2015-01-05 MED ORDER — NAPROXEN 500 MG PO TABS: 500.0000 mg | ORAL_TABLET | Freq: Two times a day (BID) | ORAL | Status: DC

## 2015-01-05 NOTE — ED Provider Notes (Signed)
CSN: [id number]     Arrival date & time 01/05/15  0903 History   First MD Initiated Contact with Patient 01/05/15 [phone]     Chief Complaint  Patient presents with  . Abdominal Pain     (Consider location/radiation/quality/duration/timing/severity/associated sxs/prior Treatment) HPI  Pt is a 24yo female with hx of chlamydia, gonorrhea, and UTI, presenting to ED with c/o 2 day hx of intermittent lower abdominal pain that is sore and cramping, 5/10 at worst. Pain is worse with intercourse.  Pt states "my ovaries hurt"  Denies hx of uterine fibroids or ovarian cysts. Denies fever, chills, n/v/d. Denies urinary frequency, urgency, hematuria or dysuria. Pt is on birth control. Denies concern for STDs. LMP: 12/13/14.     Past Medical History  Diagnosis Date  . Chlamydia infection   . Gonorrhea   . H/O varicella   . Hx: UTI (urinary tract infection)    Past Surgical History  Procedure Laterality Date  . Wisdom tooth extraction     Family History  Problem Relation Age of Onset  . Anesthesia problems Neg Hx   . Hypertension Mother   . Arthritis Mother   . Thyroid disease Mother   . Asthma Brother   . Thalassemia Brother   . Alcohol abuse Father   . Diabetes Maternal Aunt    History  Substance Use Topics  . Smoking status: Never Smoker   . Smokeless tobacco: Never Used  . Alcohol Use: Yes   OB History    Gravida Para Term Preterm AB TAB SAB Ectopic Multiple Living   1 1 1  0 0 0 0 0 0 1     Review of Systems  Constitutional: Negative for fever, chills, diaphoresis and fatigue.  Gastrointestinal: Positive for abdominal pain (lower abdomen "my ovaries hurt"). Negative for nausea, vomiting and diarrhea.  Genitourinary: Positive for vaginal pain and pelvic pain. Negative for dysuria, urgency, frequency, hematuria, flank pain, decreased urine volume, vaginal bleeding, vaginal discharge and menstrual problem.  Musculoskeletal: Negative for myalgias and back pain.  All other systems  reviewed and are negative.     Allergies  Blueberry  Home Medications   Prior to Admission medications   Medication Sig Start Date End Date Taking? Authorizing Provider  etonogestrel (NEXPLANON) 68 MG IMPL implant Inject 1 each into the skin once. 2013   Yes Historical Provider, MD  ibuprofen (ADVIL,MOTRIN) 200 MG tablet Take 400 mg by mouth every 6 (six) hours as needed for moderate pain (pain).    Yes Historical Provider, MD  cephALEXin (KEFLEX) 500 MG capsule Take 1 capsule (500 mg total) by mouth 4 (four) times daily. Patient not taking: Reported on 01/05/2015 12/18/14   Harle BattiestElizabeth Tysinger, NP  HYDROcodone-acetaminophen (NORCO/VICODIN) 5-325 MG per tablet Take 1 tablet by mouth every 4 (four) hours as needed. Patient not taking: Reported on 01/05/2015 12/18/14   Harle BattiestElizabeth Tysinger, NP  naproxen (NAPROSYN) 500 MG tablet Take 1 tablet (500 mg total) by mouth 2 (two) times daily. 01/05/15   Junius FinnerErin O'Malley, PA-C   BP 113/66 mmHg  Pulse 86  Temp(Src) 98 F (36.7 C) (Oral)  Resp 18  SpO2 98%  LMP 12/13/2014 Physical Exam  Constitutional: She appears well-developed and well-nourished. No distress.  Pt lying comfortably in exam bed, NAD  HENT:  Head: Normocephalic and atraumatic.  Eyes: Conjunctivae are normal. No scleral icterus.  Neck: Normal range of motion. Neck supple.  Cardiovascular: Normal rate, regular rhythm and normal heart sounds.   Pulmonary/Chest: Effort normal and breath  sounds normal. No respiratory distress. She has no wheezes. She has no rales. She exhibits no tenderness.  Abdominal: Soft. Bowel sounds are normal. She exhibits no distension and no mass. There is no tenderness. There is no rebound and no guarding.  Soft, non-distended, non-tender. No CVAT  Genitourinary:  Chaperoned exam. Normal external genitalia. Vaginal canal: small amount white-clear discharge. No vaginal bleed or masses. No CMT, adnexal tenderness or masses.  Musculoskeletal: Normal range of motion.   Neurological: She is alert.  Skin: Skin is warm and dry. She is not diaphoretic.  Nursing note and vitals reviewed.   ED Course  Procedures (including critical care time) Labs Review Labs Reviewed  WET PREP, GENITAL - Abnormal; Notable for the following:    WBC, Wet Prep HPF POC FEW (*)    All other components within normal limits  URINALYSIS, ROUTINE W REFLEX MICROSCOPIC (NOT AT [facility]) - Abnormal; Notable for the following:    APPearance CLOUDY (*)    Leukocytes, UA SMALL (*)    All other components within normal limits  URINE MICROSCOPIC-ADD ON - Abnormal; Notable for the following:    Squamous Epithelial / LPF MANY (*)    Bacteria, UA MANY (*)    All other components within normal limits  POC URINE PREG, ED  GC/CHLAMYDIA PROBE AMP ([place]) NOT AT [facility]    Imaging Review No results found.   EKG Interpretation None      MDM   Final diagnoses:  Pelvic pain in female   Pt is a 24yo female presenting to ED with c/o vaginal pain with intercourse and "ovary pain"  On exam, pt appears well, non-toxic, is afebrile. Abdomen is soft, non-tender. Pelvic exam: no CMT or adnexal tenderness.  Urine preg: negative, UA: unremarkable. Wet prep: normal GC/Chlamydia sent off.  Doubt appendicitis, ovarian torsion or TOA.   Pt discharged home with naproxen. Home care instructions provided. Advised to f/u with PCP at [facility]. Also advised to f/u with OB/GYN at [facility]. Return precautions provided. Pt verbalized understanding and agreement with tx plan.     Junius Finner, PA-C 01/05/15 1513  Linwood Dibbles, MD 01/06/15 [phone]

## 2015-01-05 NOTE — ED Notes (Signed)
Awake. Verbally responsive. A/O x4. Resp even and unlabored. No audible adventitious breath sounds noted. ABC's intact.  

## 2015-01-05 NOTE — ED Notes (Signed)
Awake. Verbally responsive. Resp even and unlabored. No audible adventitious breath sounds noted. ABC's intact. Lower abd soft/nondistended but tender to palpate. BS (+) and active x4 quadrants. No N/V/D reported. Pt denies vaginal bleeding/discharge.

## 2015-01-05 NOTE — ED Notes (Signed)
Pt presents to ED with c/o bilateral lower abdominal pain, sts "I've been having pain in my ovaries for two days now", denies any urinary symptoms, vaginal discharge or bleeding.

## 2015-01-06 LAB — GC/CHLAMYDIA PROBE AMP (~~LOC~~) NOT AT ARMC
Chlamydia: NEGATIVE
Neisseria Gonorrhea: NEGATIVE

## 2016-06-30 ENCOUNTER — Encounter (HOSPITAL_COMMUNITY): Payer: Self-pay | Admitting: Emergency Medicine

## 2016-06-30 ENCOUNTER — Ambulatory Visit (HOSPITAL_COMMUNITY)
Admission: EM | Admit: 2016-06-30 | Discharge: 2016-06-30 | Disposition: A | Discharge status: Home / Self Care | Payer: BLUE CROSS/BLUE SHIELD | Attending: Internal Medicine | Admitting: Internal Medicine

## 2016-06-30 DIAGNOSIS — J029 Acute pharyngitis, unspecified: Secondary | ICD-10-CM | POA: Diagnosis not present

## 2016-06-30 MED ORDER — IPRATROPIUM BROMIDE 0.06 % NA SOLN: 2.0000 | Freq: Four times a day (QID) | NASAL | 12 refills | Status: DC

## 2016-06-30 NOTE — ED Triage Notes (Signed)
[facility] with a complaint of a sore throat x 2 weeks. The patient denied any fever.

## 2016-06-30 NOTE — ED Provider Notes (Signed)
CSN: [id number]     Arrival date & time 06/30/16  1001 History   First MD Initiated Contact with Patient 06/30/16 1019     Chief Complaint  Patient presents with  . Sore Throat   (Consider location/radiation/quality/duration/timing/severity/associated sxs/prior Treatment) Patient c/o sore throat x 2 weeks   The history is provided by the patient.  Sore Throat  This is a new problem. The problem occurs constantly. The problem has not changed since onset.Nothing aggravates the symptoms. Nothing relieves the symptoms. She has tried nothing for the symptoms.    Past Medical History:  Diagnosis Date  . Chlamydia infection   . Gonorrhea   . H/O varicella   . Hx: UTI (urinary tract infection)    Past Surgical History:  Procedure Laterality Date  . WISDOM TOOTH EXTRACTION     Family History  Problem Relation Age of Onset  . Anesthesia problems Neg Hx   . Hypertension Mother   . Arthritis Mother   . Thyroid disease Mother   . Asthma Brother   . Thalassemia Brother   . Alcohol abuse Father   . Diabetes Maternal Aunt    Social History  Substance Use Topics  . Smoking status: Never Smoker  . Smokeless tobacco: Never Used  . Alcohol use Yes   OB History    Gravida Para Term Preterm AB Living   1 1 1  0 0 1   SAB TAB Ectopic Multiple Live Births   0 0 0 0 1     Review of Systems  Constitutional: Negative.   HENT: Positive for sore throat.   Eyes: Negative.   Respiratory: Negative.   Cardiovascular: Negative.   Gastrointestinal: Negative.   Endocrine: Negative.   Genitourinary: Negative.   Musculoskeletal: Negative.   Skin: Negative.   Allergic/Immunologic: Negative.   Neurological: Negative.   Hematological: Negative.   Psychiatric/Behavioral: Negative.     Allergies  Blueberry [vaccinium angustifolium]  Home Medications   Prior to Admission medications   Medication Sig Start Date End Date Taking? Authorizing Provider  etonogestrel (NEXPLANON) 68 MG IMPL  implant Inject 1 each into the skin once. 2013   Yes Historical Provider, MD  cephALEXin (KEFLEX) 500 MG capsule Take 1 capsule (500 mg total) by mouth 4 (four) times daily. Patient not taking: Reported on 01/05/2015 12/18/14   Harle BattiestElizabeth Tysinger, NP  HYDROcodone-acetaminophen (NORCO/VICODIN) 5-325 MG per tablet Take 1 tablet by mouth every 4 (four) hours as needed. Patient not taking: Reported on 01/05/2015 12/18/14   Harle BattiestElizabeth Tysinger, NP  ibuprofen (ADVIL,MOTRIN) 200 MG tablet Take 400 mg by mouth every 6 (six) hours as needed for moderate pain (pain).     Historical Provider, MD  ipratropium (ATROVENT) 0.06 % nasal spray Place 2 sprays into both nostrils 4 (four) times daily. 06/30/16   Deatra CanterWilliam J Angellina Ferdinand, FNP  naproxen (NAPROSYN) 500 MG tablet Take 1 tablet (500 mg total) by mouth 2 (two) times daily. 01/05/15   Junius FinnerErin O'Malley, PA-C   Meds Ordered and Administered this Visit  Medications - No data to display  BP 139/84 (BP Location: Right Arm)   Pulse 79   Temp 97.7 F (36.5 C) (Oral)   Resp 14   SpO2 99%  No data found.   Physical Exam  Constitutional: She appears well-developed and well-nourished.  HENT:  Head: Normocephalic and atraumatic.  Right Ear: External ear normal.  Left Ear: External ear normal.  Mouth/Throat: Oropharynx is clear and moist.  Eyes: Conjunctivae and EOM are normal. Pupils  are equal, round, and reactive to light.  Neck: Normal range of motion. Neck supple.  Cardiovascular: Normal rate, regular rhythm and normal heart sounds.   Pulmonary/Chest: Effort normal and breath sounds normal.  Abdominal: Soft.  Nursing note and vitals reviewed.   Urgent Care Course   Clinical Course     Procedures (including critical care time)  Labs Review Labs Reviewed - No data to display  Imaging Review No results found.   Visual Acuity Review  Right Eye Distance:   Left Eye Distance:   Bilateral Distance:    Right Eye Near:   Left Eye Near:    Bilateral Near:          MDM   1. Viral pharyngitis    Push po fluids, rest, tylenol and motrin otc prn as directed for fever, arthralgias, and myalgias.  Follow up prn if sx's continue or persist.    Deatra CanterWilliam J Velta Rockholt, FNP 06/30/16 1133

## 2017-01-14 ENCOUNTER — Encounter (HOSPITAL_COMMUNITY): Payer: Self-pay | Admitting: *Deleted

## 2017-01-14 ENCOUNTER — Ambulatory Visit (HOSPITAL_COMMUNITY)
Admission: EM | Admit: 2017-01-14 | Discharge: 2017-01-14 | Disposition: A | Discharge status: Home / Self Care | Payer: BLUE CROSS/BLUE SHIELD | Attending: Internal Medicine | Admitting: Internal Medicine

## 2017-01-14 DIAGNOSIS — S161XXA Strain of muscle, fascia and tendon at neck level, initial encounter: Secondary | ICD-10-CM | POA: Diagnosis not present

## 2017-01-14 MED ORDER — CYCLOBENZAPRINE HCL 10 MG PO TABS: 10.0000 mg | ORAL_TABLET | Freq: Two times a day (BID) | ORAL | 0 refills | Status: DC | PRN

## 2017-01-14 MED ORDER — NAPROXEN 500 MG PO TABS: 500.0000 mg | ORAL_TABLET | Freq: Two times a day (BID) | ORAL | 0 refills | Status: DC
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
## 2017-03-24 ENCOUNTER — Encounter (HOSPITAL_COMMUNITY): Payer: Self-pay | Admitting: Emergency Medicine

## 2017-03-24 ENCOUNTER — Ambulatory Visit (HOSPITAL_COMMUNITY)
Admission: EM | Admit: 2017-03-24 | Discharge: 2017-03-24 | Disposition: A | Discharge status: Home / Self Care | Payer: BLUE CROSS/BLUE SHIELD | Attending: Emergency Medicine | Admitting: Emergency Medicine

## 2017-03-24 DIAGNOSIS — L300 Nummular dermatitis: Secondary | ICD-10-CM | POA: Diagnosis not present

## 2017-03-24 DIAGNOSIS — I889 Nonspecific lymphadenitis, unspecified: Secondary | ICD-10-CM

## 2017-03-24 DIAGNOSIS — N649 Disorder of breast, unspecified: Secondary | ICD-10-CM

## 2017-03-24 MED ORDER — CLOBETASOL PROP EMOLLIENT BASE 0.05 % EX CREA: 1.0000 "application " | TOPICAL_CREAM | Freq: Two times a day (BID) | CUTANEOUS | 0 refills | Status: DC

## 2017-03-24 MED ORDER — PREDNISONE 20 MG PO TABS: ORAL_TABLET | ORAL | 0 refills | Status: DC

## 2017-03-24 NOTE — Discharge Instructions (Signed)
°  The lesion on your breast is concerning for potential skin cancer. It is highly recommended you follow up with a dermatologist for further evaluation, and likely a skin biopsy.   A dermatologist will also be able to help with the rash on your lower leg if the medications prescribed today do not help within 2-4 weeks.   No Primary Care Doctor: Call [facility] at  [phone] - they can help you locate a primary care doctor that  accepts your insurance, provides certain services, etc. Physician Referral Service- [phone]

## 2017-03-24 NOTE — ED Triage Notes (Signed)
PT has a large patch of itchy rash on left ankle that has been present for 3 months. PT reports other bumps have arisen in the last week.

## 2017-03-24 NOTE — ED Provider Notes (Signed)
[facility]    CSN: [id number] Arrival date & time: 03/24/17  1113     History   Chief Complaint Chief Complaint  Patient presents with  . Rash    HPI Tracy Haynes is a 26 y.o. female.   HPI  Tracy Haynes is a 26 y.o. female presenting to UC with c/o large itchy dried circular rash on Left lower leg for about 3 months. No relief with OTC Athlete's foot cream or eczema cream.  Denies pain to the rash. No new soaps, lotions or medications. No other similar looking rashes. She also c/o a small bump in Right axilla. Denies pain, redness, or worsening of swelling. Left breat, she has a darkened rash in areola space for a few weeks. Non-tender. No nipple discharge or pain.     Past Medical History:  Diagnosis Date  . Chlamydia infection   . Gonorrhea   . H/O varicella   . Hx: UTI (urinary tract infection)     Patient Active Problem List   Diagnosis Date Noted  . Vaginal laceration 02/09/2012  . Normal pregnancy, first 11/17/2011  . Gonorrhea 11/11/2011  . Chlamydia infection, current pregnancy 11/11/2011    Past Surgical History:  Procedure Laterality Date  . WISDOM TOOTH EXTRACTION      OB History    Gravida Para Term Preterm AB Living   1 1 1  0 0 1   SAB TAB Ectopic Multiple Live Births   0 0 0 0 1       Home Medications    Prior to Admission medications   Medication Sig Start Date End Date Taking? Authorizing Provider  cephALEXin (KEFLEX) 500 MG capsule Take 1 capsule (500 mg total) by mouth 4 (four) times daily. Patient not taking: Reported on 01/05/2015 12/18/14   Harle Battiest, NP  Clobetasol Prop Emollient Base (CLOBETASOL PROPIONATE E) 0.05 % emollient cream Apply 1 application topically 2 (two) times daily. To Left lower leg for 2-4 weeks 03/24/17   Lurene Shadow, PA-C  cyclobenzaprine (FLEXERIL) 10 MG tablet Take 1 tablet (10 mg total) by mouth 2 (two) times daily as needed for muscle spasms. 01/14/17   Deatra Canter, FNP    etonogestrel (NEXPLANON) 68 MG IMPL implant Inject 1 each into the skin once. 2013    [provider]  HYDROcodone-acetaminophen (NORCO/VICODIN) 5-325 MG per tablet Take 1 tablet by mouth every 4 (four) hours as needed. Patient not taking: Reported on 01/05/2015 12/18/14   Harle Battiest, NP  ibuprofen (ADVIL,MOTRIN) 200 MG tablet Take 400 mg by mouth every 6 (six) hours as needed for moderate pain (pain).     [provider]  ipratropium (ATROVENT) 0.06 % nasal spray Place 2 sprays into both nostrils 4 (four) times daily. 06/30/16   Deatra Canter, FNP  naproxen (NAPROSYN) 500 MG tablet Take 1 tablet (500 mg total) by mouth 2 (two) times daily. 01/05/15   Lurene Shadow, PA-C  naproxen (NAPROSYN) 500 MG tablet Take 1 tablet (500 mg total) by mouth 2 (two) times daily with a meal. 01/14/17   [place], Anselm Pancoast, FNP  predniSONE (DELTASONE) 20 MG tablet 3 tabs po day one, then 2 po daily x 4 days 03/24/17   Lurene Shadow, PA-C    Family History Family History  Problem Relation Age of Onset  . Hypertension Mother   . Arthritis Mother   . Thyroid disease Mother   . Asthma Brother   . Thalassemia Brother   .  Alcohol abuse Father   . Diabetes Maternal Aunt   . Anesthesia problems Neg Hx     Social History Social History  Substance Use Topics  . Smoking status: Never Smoker  . Smokeless tobacco: Never Used  . Alcohol use Yes     Allergies   Blueberry [vaccinium angustifolium]   Review of Systems Review of Systems  Constitutional: Negative for chills, fever and unexpected weight change.  Cardiovascular: Negative for chest pain.  Musculoskeletal: Negative for arthralgias, joint swelling and myalgias.  Skin: Positive for color change and rash. Negative for wound.     Physical Exam Triage Vital Signs ED Triage Vitals  Enc Vitals Group     BP 03/24/17 1124 133/81     Pulse Rate 03/24/17 1124 76     Resp 03/24/17 1124 16     Temp 03/24/17 1124 98 F (36.7  C)     Temp Source 03/24/17 1124 Oral     SpO2 03/24/17 1124 100 %     Weight 03/24/17 1124 153 lb (69.4 kg)     Height 03/24/17 1124 5\' 1"  (1.549 m)     Head Circumference --      Peak Flow --      Pain Score 03/24/17 1129 2     Pain Loc --      Pain Edu? --      Excl. in GC? --    No data found.   Updated Vital Signs BP 133/81 (BP Location: Right Arm)   Pulse 76   Temp 98 F (36.7 C) (Oral)   Resp 16   Ht 5\' 1"  (1.549 m)   Wt 153 lb (69.4 kg)   SpO2 100%   BMI 28.91 kg/m   Visual Acuity Right Eye Distance:   Left Eye Distance:   Bilateral Distance:    Right Eye Near:   Left Eye Near:    Bilateral Near:     Physical Exam  Constitutional: She is oriented to person, place, and time. She appears well-developed and well-nourished. No distress.  HENT:  Head: Normocephalic and atraumatic.  Eyes: EOM are normal.  Neck: Normal range of motion.  Cardiovascular: Normal rate.   Pulmonary/Chest: Effort normal.    Left breast: hyperpigmented flattened skin lesion. Non-tender. No mass palpated. No nipple discharge.   Musculoskeletal: Normal range of motion. She exhibits no edema or tenderness.  Neurological: She is alert and oriented to person, place, and time.  Skin: Skin is warm and dry. She is not diaphoretic.  Right axilla: pea-sized non-tender, mobile nodule c/w axillary lymph node. No erythema. No red streaking.   Left lower leg: 3x4cm circular dried flaking lesion. Non-tender. No bleeding or drainage.   Psychiatric: She has a normal mood and affect. Her behavior is normal.  Nursing note and vitals reviewed.    UC Treatments / Results  Labs (all labs ordered are listed, but only abnormal results are displayed) Labs Reviewed - No data to display  EKG  EKG Interpretation None       Radiology No results found.  Procedures Procedures (including critical care time)  Medications Ordered in UC Medications - No data to display   Initial Impression /  Assessment and Plan / UC Course  I have reviewed the triage vital signs and the nursing notes.  Pertinent labs & imaging results that were available during my care of the patient were reviewed by me and considered in my medical decision making (see chart for details).  Final Clinical Impressions(s) / UC Diagnoses   Final diagnoses:  Nummular eczema  Lesion of breast  Axillary lymphadenitis   Rash on Left lower leg most c/w nummular eczema, will have pt try 2-4 weeks of clobetasol cream and start on PO prednisone for 5 days.  Right axilla: exam most c/w lymph node w/o underlying infection  Skin lesion on Left breast: concerning for possible skin cancer. Strongly encouraged f/u with dermatologist for further evaluation, likely skin biopsy  Home care instructions provided. Pt verbalized understanding and agreement with tx plan.    New Prescriptions Discharge Medication List as of 03/24/2017 11:49 AM    START taking these medications   Details  Clobetasol Prop Emollient Base (CLOBETASOL PROPIONATE E) 0.05 % emollient cream Apply 1 application topically 2 (two) times daily. To Left lower leg for 2-4 weeks, Starting Thu 03/24/2017, Normal    predniSONE (DELTASONE) 20 MG tablet 3 tabs po day one, then 2 po daily x 4 days, Normal         Controlled Substance Prescriptions [place] Controlled Substance Registry consulted? Not Applicable   Rolla Plate 03/24/17 1314

## 2017-04-02 ENCOUNTER — Encounter (HOSPITAL_COMMUNITY): Payer: Self-pay | Admitting: Emergency Medicine

## 2017-04-02 ENCOUNTER — Emergency Department (HOSPITAL_COMMUNITY)
Admission: EM | Admit: 2017-04-02 | Discharge: 2017-04-02 | Disposition: A | Discharge status: Home / Self Care | Payer: BLUE CROSS/BLUE SHIELD | Attending: Emergency Medicine | Admitting: Emergency Medicine

## 2017-04-02 DIAGNOSIS — I10 Essential (primary) hypertension: Secondary | ICD-10-CM

## 2017-04-02 DIAGNOSIS — Z79899 Other long term (current) drug therapy: Secondary | ICD-10-CM | POA: Diagnosis not present

## 2017-04-02 DIAGNOSIS — N3090 Cystitis, unspecified without hematuria: Secondary | ICD-10-CM | POA: Diagnosis not present

## 2017-04-02 DIAGNOSIS — R300 Dysuria: Secondary | ICD-10-CM | POA: Diagnosis present

## 2017-04-02 DIAGNOSIS — N3000 Acute cystitis without hematuria: Secondary | ICD-10-CM | POA: Diagnosis not present

## 2017-04-02 LAB — URINALYSIS, COMPLETE (UACMP) WITH MICROSCOPIC
BILIRUBIN URINE: NEGATIVE
Glucose, UA: 50 mg/dL — AB
Ketones, ur: NEGATIVE mg/dL
Nitrite: NEGATIVE
PROTEIN: 30 mg/dL — AB
SPECIFIC GRAVITY, URINE: 1.023 (ref 1.005–1.030)
pH: 6 (ref 5.0–8.0)

## 2017-04-02 LAB — PREGNANCY, URINE: PREG TEST UR: NEGATIVE

## 2017-04-02 MED ORDER — PHENAZOPYRIDINE HCL 200 MG PO TABS: 200.0000 mg | ORAL_TABLET | Freq: Three times a day (TID) | ORAL | 0 refills | Status: DC

## 2017-04-02 MED ORDER — CEPHALEXIN 500 MG PO CAPS: 500.0000 mg | ORAL_CAPSULE | Freq: Four times a day (QID) | ORAL | 0 refills | Status: DC

## 2017-04-02 NOTE — ED Triage Notes (Signed)
Patient reports urinary frequency and dysuria " burning" onset last week , denies hematuria or fever .

## 2017-04-02 NOTE — ED Notes (Signed)
ED Provider at bedside. 

## 2017-04-02 NOTE — ED Provider Notes (Addendum)
[facility]   CSN: [id number] Arrival date & time: 04/02/17  2107     History   Chief Complaint Chief Complaint  Patient presents with  . Dysuria    HPI Tracy Haynes is a 26 y.o. female.  HPI  26 year old female presents today complaining of one-week history of pain with urination and frequency of urination.She identifies this as similar to her previous urinary tract infections. She is taking birth control and has somewhat irregular menses. She denies any abnormal vaginal discharge or pelvic pain.no nausea, vomiting, fever, chills, or diarrhea.  Past Medical History:  Diagnosis Date  . Chlamydia infection   . Gonorrhea   . H/O varicella   . Hx: UTI (urinary tract infection)     Patient Active Problem List   Diagnosis Date Noted  . Vaginal laceration 02/09/2012  . Normal pregnancy, first 11/17/2011  . Gonorrhea 11/11/2011  . Chlamydia infection, current pregnancy 11/11/2011    Past Surgical History:  Procedure Laterality Date  . WISDOM TOOTH EXTRACTION      OB History    Gravida Para Term Preterm AB Living   1 1 1  0 0 1   SAB TAB Ectopic Multiple Live Births   0 0 0 0 1       Home Medications    Prior to Admission medications   Medication Sig Start Date End Date Taking? Authorizing Provider  cephALEXin (KEFLEX) 500 MG capsule Take 1 capsule (500 mg total) by mouth 4 (four) times daily. Patient not taking: Reported on 01/05/2015 12/18/14   Harle Battiestysinger, Elizabeth, NP  Clobetasol Prop Emollient Base (CLOBETASOL PROPIONATE E) 0.05 % emollient cream Apply 1 application topically 2 (two) times daily. To Left lower leg for 2-4 weeks 03/24/17   Lurene ShadowPhelps, Erin O, PA-C  cyclobenzaprine (FLEXERIL) 10 MG tablet Take 1 tablet (10 mg total) by mouth 2 (two) times daily as needed for muscle spasms. 01/14/17   Deatra Canterxford, William J, FNP  etonogestrel (NEXPLANON) 68 MG IMPL implant Inject 1 each into the skin once. 2013    [provider]    HYDROcodone-acetaminophen (NORCO/VICODIN) 5-325 MG per tablet Take 1 tablet by mouth every 4 (four) hours as needed. Patient not taking: Reported on 01/05/2015 12/18/14   Harle Battiestysinger, Elizabeth, NP  ibuprofen (ADVIL,MOTRIN) 200 MG tablet Take 400 mg by mouth every 6 (six) hours as needed for moderate pain (pain).     [provider]  ipratropium (ATROVENT) 0.06 % nasal spray Place 2 sprays into both nostrils 4 (four) times daily. 06/30/16   Deatra Canterxford, William J, FNP  naproxen (NAPROSYN) 500 MG tablet Take 1 tablet (500 mg total) by mouth 2 (two) times daily. 01/05/15   Lurene ShadowPhelps, Erin O, PA-C  naproxen (NAPROSYN) 500 MG tablet Take 1 tablet (500 mg total) by mouth 2 (two) times daily with a meal. 01/14/17   Oxford, Anselm PancoastWilliam J, FNP  predniSONE (DELTASONE) 20 MG tablet 3 tabs po day one, then 2 po daily x 4 days 03/24/17   Lurene ShadowPhelps, Erin O, PA-C    Family History Family History  Problem Relation Age of Onset  . Hypertension Mother   . Arthritis Mother   . Thyroid disease Mother   . Asthma Brother   . Thalassemia Brother   . Alcohol abuse Father   . Diabetes Maternal Aunt   . Anesthesia problems Neg Hx     Social History Social History  Substance Use Topics  . Smoking status: Never Smoker  . Smokeless tobacco: Never Used  .  Alcohol use Yes     Allergies   Blueberry [vaccinium angustifolium]   Review of Systems Review of Systems  All other systems reviewed and are negative.    Physical Exam Updated Vital Signs BP (!) 132/102 (BP Location: Right Arm)   Pulse 88   Temp 98.2 F (36.8 C) (Oral)   Resp 16   LMP 03/26/2017 (Approximate)   SpO2 99%   Physical Exam  Constitutional: She is oriented to person, place, and time. She appears well-developed and well-nourished. No distress.  HENT:  Head: Normocephalic and atraumatic.  Right Ear: External ear normal.  Left Ear: External ear normal.  Nose: Nose normal.  Eyes: Pupils are equal, round, and reactive to light. Conjunctivae  and EOM are normal.  Neck: Normal range of motion. Neck supple.  Cardiovascular: Normal rate and regular rhythm.   Pulmonary/Chest: Effort normal.  Genitourinary:  Genitourinary Comments: No CVA tenderness  Musculoskeletal: Normal range of motion.  Neurological: She is alert and oriented to person, place, and time. She exhibits normal muscle tone. Coordination normal.  Skin: Skin is warm and dry.  Psychiatric: She has a normal mood and affect. Her behavior is normal. Thought content normal.  Nursing note and vitals reviewed.    ED Treatments / Results  Labs (all labs ordered are listed, but only abnormal results are displayed) Labs Reviewed  URINALYSIS, COMPLETE (UACMP) WITH MICROSCOPIC - Abnormal; Notable for the following:       Result Value   APPearance HAZY (*)    Glucose, UA 50 (*)    Hgb urine dipstick SMALL (*)    Protein, ur 30 (*)    Leukocytes, UA LARGE (*)    Bacteria, UA RARE (*)    Squamous Epithelial / LPF 0-5 (*)    All other components within normal limits  PREGNANCY, URINE    EKG  EKG Interpretation None       Radiology No results found.  Procedures Procedures (including critical care time)  Medications Ordered in ED Medications - No data to display   Initial Impression / Assessment and Plan / ED Course  I have reviewed the triage vital signs and the nursing notes.  Pertinent labs & imaging results that were available during my care of the patient were reviewed by me and considered in my medical decision making (see chart for details).       Final Clinical Impressions(s) / ED Diagnoses   Final diagnoses:  Cystitis    New Prescriptions New Prescriptions   CEPHALEXIN (KEFLEX) 500 MG CAPSULE    Take 1 capsule (500 mg total) by mouth 4 (four) times daily.   PHENAZOPYRIDINE (PYRIDIUM) 200 MG TABLET    Take 1 tablet (200 mg total) by mouth 3 (three) times daily.     Margarita Grizzle, MD 04/02/17 0865    Margarita Grizzle, MD 04/02/17  2150

## 2017-06-27 DIAGNOSIS — H4710 Unspecified papilledema: Secondary | ICD-10-CM | POA: Diagnosis not present

## 2017-06-28 DIAGNOSIS — L309 Dermatitis, unspecified: Secondary | ICD-10-CM | POA: Diagnosis not present

## 2017-06-28 DIAGNOSIS — R0602 Shortness of breath: Secondary | ICD-10-CM | POA: Diagnosis not present

## 2017-06-28 DIAGNOSIS — R635 Abnormal weight gain: Secondary | ICD-10-CM | POA: Diagnosis not present

## 2017-06-28 DIAGNOSIS — N946 Dysmenorrhea, unspecified: Secondary | ICD-10-CM | POA: Diagnosis not present

## 2017-06-29 DIAGNOSIS — L300 Nummular dermatitis: Secondary | ICD-10-CM | POA: Diagnosis not present

## 2017-06-29 DIAGNOSIS — Z3046 Encounter for surveillance of implantable subdermal contraceptive: Secondary | ICD-10-CM | POA: Diagnosis not present

## 2017-06-29 DIAGNOSIS — Z113 Encounter for screening for infections with a predominantly sexual mode of transmission: Secondary | ICD-10-CM | POA: Diagnosis not present

## 2017-07-13 DIAGNOSIS — L300 Nummular dermatitis: Secondary | ICD-10-CM | POA: Diagnosis not present

## 2017-07-13 DIAGNOSIS — L700 Acne vulgaris: Secondary | ICD-10-CM | POA: Diagnosis not present

## 2017-08-25 ENCOUNTER — Ambulatory Visit: Payer: BLUE CROSS/BLUE SHIELD | Admitting: Neurology

## 2017-08-30 ENCOUNTER — Ambulatory Visit: Payer: BLUE CROSS/BLUE SHIELD | Admitting: Neurology

## 2017-09-01 ENCOUNTER — Encounter: Payer: Self-pay | Admitting: Neurology

## 2017-09-25 ENCOUNTER — Emergency Department (HOSPITAL_COMMUNITY)
Admission: EM | Admit: 2017-09-25 | Discharge: 2017-09-25 | Disposition: A | Discharge status: Home / Self Care | Payer: BLUE CROSS/BLUE SHIELD | Attending: Emergency Medicine | Admitting: Emergency Medicine

## 2017-09-25 DIAGNOSIS — Z79899 Other long term (current) drug therapy: Secondary | ICD-10-CM | POA: Insufficient documentation

## 2017-09-25 DIAGNOSIS — N390 Urinary tract infection, site not specified: Secondary | ICD-10-CM | POA: Diagnosis not present

## 2017-09-25 DIAGNOSIS — R102 Pelvic and perineal pain: Secondary | ICD-10-CM | POA: Diagnosis not present

## 2017-09-25 DIAGNOSIS — R1030 Lower abdominal pain, unspecified: Secondary | ICD-10-CM | POA: Diagnosis not present

## 2017-09-25 DIAGNOSIS — R319 Hematuria, unspecified: Secondary | ICD-10-CM | POA: Diagnosis not present

## 2017-09-25 LAB — URINALYSIS, ROUTINE W REFLEX MICROSCOPIC
BILIRUBIN URINE: NEGATIVE
Bacteria, UA: NONE SEEN
GLUCOSE, UA: NEGATIVE mg/dL
KETONES UR: NEGATIVE mg/dL
NITRITE: POSITIVE — AB
PH: 6 (ref 5.0–8.0)
Protein, ur: NEGATIVE mg/dL
Specific Gravity, Urine: 1.023 (ref 1.005–1.030)

## 2017-09-25 LAB — POC URINE PREG, ED: Preg Test, Ur: NEGATIVE

## 2017-09-25 MED ORDER — CEPHALEXIN 500 MG PO CAPS: 500.0000 mg | ORAL_CAPSULE | Freq: Two times a day (BID) | ORAL | 0 refills | Status: AC

## 2017-09-25 NOTE — ED Notes (Signed)
Pt called for triage x3. No answer.  

## 2017-09-25 NOTE — Discharge Instructions (Signed)
Please follow up with your primary care provider within 5-7 days for re-evaluation of your symptoms. If you do not have a primary care provider, information for a healthcare clinic has been provided for you to make arrangements for follow up care. Please return to the emergency department for any new or worsening symptoms, or if you develop any of the following symptoms:  You develop back pain.  Your symptoms are no better or worse in 3 days. There is severe back pain or lower abdominal pain.  You develop chills.  You have a fever.  There is nausea or vomiting.  There is continued burning or discomfort with urination.

## 2017-09-25 NOTE — ED Notes (Signed)
Declined W/C at D/C and was escorted to lobby by RN. 

## 2017-09-25 NOTE — ED Provider Notes (Signed)
[facility] EMERGENCY DEPARTMENT Provider Note   CSN: [id number] Arrival date & time: 09/25/17  1237     History   Chief Complaint Chief Complaint  Patient presents with  . Recurrent UTI    HPI Tracy Haynes is a 27 y.o. female.  HPI   27 y/o female who presents to the ED c/o dysuria that began 2 days ago. She also c/o lower abdominal pain when she urinates, frequency, hematuria. States she feels like she has to urinate and only a dribble comes out. States symptoms feel consistent with prior utis. She denies any fevers, hematuria, nausea, vomiting, vaginal discharge, vaginal irritation, vaginal lesion,  vaginal bleeding. Pt denies urinary retention. Has tried taking Azo with no relief. Denies concern for STD today, stating she has been monogamous with one partner for 3 years.   Past Medical History:  Diagnosis Date  . Chlamydia infection   . Gonorrhea   . H/O varicella   . Hx: UTI (urinary tract infection)     Patient Active Problem List   Diagnosis Date Noted  . Vaginal laceration 02/09/2012  . Normal pregnancy, first 11/17/2011  . Gonorrhea 11/11/2011  . Chlamydia infection, current pregnancy 11/11/2011    Past Surgical History:  Procedure Laterality Date  . WISDOM TOOTH EXTRACTION      OB History    Gravida Para Term Preterm AB Living   1 1 1  0 0 1   SAB TAB Ectopic Multiple Live Births   0 0 0 0 1       Home Medications    Prior to Admission medications   Medication Sig Start Date End Date Taking? Authorizing Provider  cephALEXin (KEFLEX) 500 MG capsule Take 1 capsule (500 mg total) by mouth 2 (two) times daily for 7 days. 09/25/17 10/02/17  Lyan Moyano S, PA-C  Clobetasol Prop Emollient Base (CLOBETASOL PROPIONATE E) 0.05 % emollient cream Apply 1 application topically 2 (two) times daily. To Left lower leg for 2-4 weeks 03/24/17   Lurene Shadow, PA-C  cyclobenzaprine (FLEXERIL) 10 MG tablet Take 1 tablet (10 mg total) by mouth 2  (two) times daily as needed for muscle spasms. 01/14/17   Deatra Canter, FNP  etonogestrel (NEXPLANON) 68 MG IMPL implant Inject 1 each into the skin once. 2013    [provider]  HYDROcodone-acetaminophen (NORCO/VICODIN) 5-325 MG per tablet Take 1 tablet by mouth every 4 (four) hours as needed. Patient not taking: Reported on 01/05/2015 12/18/14   Harle Battiest, NP  ibuprofen (ADVIL,MOTRIN) 200 MG tablet Take 400 mg by mouth every 6 (six) hours as needed for moderate pain (pain).     [provider]  ipratropium (ATROVENT) 0.06 % nasal spray Place 2 sprays into both nostrils 4 (four) times daily. 06/30/16   Deatra Canter, FNP  naproxen (NAPROSYN) 500 MG tablet Take 1 tablet (500 mg total) by mouth 2 (two) times daily. 01/05/15   Lurene Shadow, PA-C  naproxen (NAPROSYN) 500 MG tablet Take 1 tablet (500 mg total) by mouth 2 (two) times daily with a meal. 01/14/17   [place], Anselm Pancoast, FNP  phenazopyridine (PYRIDIUM) 200 MG tablet Take 1 tablet (200 mg total) by mouth 3 (three) times daily. 04/02/17   Margarita Grizzle, MD  predniSONE (DELTASONE) 20 MG tablet 3 tabs po day one, then 2 po daily x 4 days 03/24/17   Lurene Shadow, PA-C    Family History Family History  Problem Relation Age of Onset  .  Hypertension Mother   . Arthritis Mother   . Thyroid disease Mother   . Asthma Brother   . Thalassemia Brother   . Alcohol abuse Father   . Diabetes Maternal Aunt   . Anesthesia problems Neg Hx     Social History Social History   Tobacco Use  . Smoking status: Never Smoker  . Smokeless tobacco: Never Used  Substance Use Topics  . Alcohol use: Yes  . Drug use: No     Allergies   Blueberry [vaccinium angustifolium]   Review of Systems Review of Systems  Constitutional: Negative for chills and fever.  HENT: Negative for sore throat.   Respiratory: Negative for shortness of breath.   Cardiovascular: Negative for chest pain.  Gastrointestinal: Negative for  abdominal pain, constipation, diarrhea, nausea and vomiting.  Genitourinary: Positive for dysuria, frequency, pelvic pain and urgency. Negative for flank pain, hematuria, vaginal bleeding, vaginal discharge and vaginal pain.  Neurological: Negative for headaches.     Physical Exam Updated Vital Signs BP 130/82 (BP Location: Right Arm)   Pulse 80   Temp 98.1 F (36.7 C) (Oral)   Resp 18   LMP 07/18/2017 (Approximate)   SpO2 97%   Physical Exam  Constitutional: She appears well-developed and well-nourished. No distress.  nontoxic  HENT:  Head: Normocephalic and atraumatic.  Eyes: Conjunctivae are normal.  Neck: Neck supple.  Cardiovascular: Normal rate, regular rhythm and normal heart sounds.  No murmur heard. Pulmonary/Chest: Effort normal and breath sounds normal. No respiratory distress. She has no wheezes.  Abdominal: Soft. Bowel sounds are normal. She exhibits no distension. There is no tenderness. There is no guarding.  No CVA tenderness.  No tenderness across the abdomen or suprapubic area.  No pelvic tenderness to palpation.  Musculoskeletal: She exhibits no edema.  Neurological: She is alert.  Skin: Skin is warm and dry.  Psychiatric: She has a normal mood and affect.  Nursing note and vitals reviewed.    ED Treatments / Results  Labs (all labs ordered are listed, but only abnormal results are displayed) Labs Reviewed  URINALYSIS, ROUTINE W REFLEX MICROSCOPIC - Abnormal; Notable for the following components:      Result Value   Color, Urine AMBER (*)    APPearance CLOUDY (*)    Hgb urine dipstick SMALL (*)    Nitrite POSITIVE (*)    Leukocytes, UA LARGE (*)    Squamous Epithelial / LPF 6-30 (*)    Non Squamous Epithelial 0-5 (*)    All other components within normal limits  URINE CULTURE  POC URINE PREG, ED    EKG  EKG Interpretation None       Radiology No results found.  Procedures Procedures (including critical care time)  Medications  Ordered in ED Medications - No data to display   Initial Impression / Assessment and Plan / ED Course  I have reviewed the triage vital signs and the nursing notes.  Pertinent labs & imaging results that were available during my care of the patient were reviewed by me and considered in my medical decision making (see chart for details).     Final Clinical Impressions(s) / ED Diagnoses   Final diagnoses:  None   Pt has been diagnosed with a UTI. Pt is afebrile, no CVA tenderness, normotensive, and denies N/V. Pt to be dc home with antibiotics and instructions to follow up with PCP if symptoms persist.  Return precautions discussed and patient understands reasons to return to the ED.  ED Discharge Orders        Ordered    cephALEXin (KEFLEX) 500 MG capsule  2 times daily     09/25/17 [address]       Eriel Doyon S, PA-C 09/25/17 1502    Cathren LaineSteinl, Kevin, MD 09/25/17 1536

## 2017-09-25 NOTE — ED Triage Notes (Signed)
Pt to ER for evaluation of 2 days burning/stinging/retention with urination. Denies all other symptoms.

## 2017-09-27 LAB — URINE CULTURE

## 2017-09-28 ENCOUNTER — Telehealth: Payer: Self-pay | Admitting: Emergency Medicine

## 2017-09-28 NOTE — Telephone Encounter (Signed)
Post ED Visit - Positive Culture Follow-up  Culture report reviewed by antimicrobial stewardship pharmacist:  []  Enzo BiNathan Batchelder, Pharm.D. []  Celedonio MiyamotoJeremy Frens, Pharm.D., BCPS AQ-ID []  Garvin FilaMike Maccia, Pharm.D., BCPS [x]  Georgina PillionElizabeth Martin, Pharm.D., BCPS []  Rio RanchoMinh Pham, [address] BoulevardPharm.D., BCPS, AAHIVP []  Estella HuskMichelle Turner, Pharm.D., BCPS, AAHIVP []  Lysle Pearlachel Rumbarger, PharmD, BCPS []  Blake DivineShannon Parkey, PharmD []  Pollyann SamplesAndy Johnston, PharmD, BCPS  Positive urine culture Treated with cephalexin, organism sensitive to the same and no further patient follow-up is required at this time.  Berle MullMiller, Farran Amsden 09/28/2017, 3:01 PM

## 2017-09-28 NOTE — Telephone Encounter (Signed)
Post ED Visit - Positive Culture Follow-up  Culture report reviewed by antimicrobial stewardship pharmacist:  []  Enzo BiNathan Batchelder, Pharm.D. []  Celedonio MiyamotoJeremy Frens, Pharm.D., BCPS AQ-ID []  Garvin FilaMike Maccia, Pharm.D., BCPS [x]  Georgina PillionElizabeth Martin, Pharm.D., BCPS []  ChamizalMinh Pham, VermontPharm.D., BCPS, AAHIVP []  Estella HuskMichelle Turner, Pharm.D., BCPS, AAHIVP []  Lysle Pearlachel Rumbarger, PharmD, BCPS []  Blake DivineShannon Parkey, PharmD []  Pollyann SamplesAndy Johnston, PharmD, BCPS  Positive urine culture Treated with cephalexin, organism sensitive to the same and no further patient follow-up is required at this time.  Berle MullMiller, Quantasia Stegner 09/28/2017, 2:57 PM

## 2017-10-18 ENCOUNTER — Telehealth: Payer: Self-pay | Admitting: Radiology

## 2017-10-18 ENCOUNTER — Encounter: Payer: Self-pay | Admitting: Neurology

## 2017-10-18 ENCOUNTER — Ambulatory Visit: Payer: BLUE CROSS/BLUE SHIELD | Admitting: Neurology

## 2017-10-18 ENCOUNTER — Telehealth: Payer: Self-pay | Admitting: Neurology

## 2017-10-18 VITALS — BP 106/65 | HR 62 | Ht 61.0 in | Wt 156.2 lb

## 2017-10-18 DIAGNOSIS — H4710 Unspecified papilledema: Secondary | ICD-10-CM | POA: Diagnosis not present

## 2017-10-18 DIAGNOSIS — G08 Intracranial and intraspinal phlebitis and thrombophlebitis: Secondary | ICD-10-CM

## 2017-10-18 DIAGNOSIS — H05111 Granuloma of right orbit: Secondary | ICD-10-CM

## 2017-10-18 DIAGNOSIS — R51 Headache with orthostatic component, not elsewhere classified: Secondary | ICD-10-CM

## 2017-10-18 DIAGNOSIS — G8929 Other chronic pain: Secondary | ICD-10-CM

## 2017-10-18 DIAGNOSIS — R519 Headache, unspecified: Secondary | ICD-10-CM | POA: Insufficient documentation

## 2017-10-18 DIAGNOSIS — G932 Benign intracranial hypertension: Secondary | ICD-10-CM

## 2017-10-18 MED ORDER — ACETAZOLAMIDE 250 MG PO TABS: 250.0000 mg | ORAL_TABLET | Freq: Two times a day (BID) | ORAL | 6 refills | Status: DC

## 2017-10-18 NOTE — Telephone Encounter (Signed)
Spoke to the patient and she is scheduled for 10/26/17 for the [facility] mobile unit.

## 2017-10-18 NOTE — Telephone Encounter (Signed)
[employer] Auth: [id number] (exp. 11/16/17 Left a voicemail for patient to schedule mri.

## 2017-10-18 NOTE — Patient Instructions (Addendum)
Idiopathic Intracranial Hypertension Idiopathic intracranial hypertension (IIH) is a condition that increases pressure around the brain. The fluid that surrounds the brain and spinal cord (cerebrospinal fluid, CSF) increases and causes the pressure. Idiopathic means that the cause of this condition is not known. IIH affects the brain and spinal cord (is a neurological disorder). If this condition is not treated, it can cause vision loss or blindness. What increases the risk? You are more likely to develop this condition if:  You are severely overweight (obese).  You are a woman who has not gone through menopause.  You take certain medicines, such as birth control or steroids.  What are the signs or symptoms? Symptoms of IIH include:  Headaches. This is the most common symptom.  Pain in the shoulders or neck.  Nausea and vomiting.  A "rushing water" or pulsing sound within the ears (pulsatile tinnitus).  Double vision.  Blurred vision.  Brief episodes of complete vision loss.  How is this diagnosed? This condition may be diagnosed based on:  Your symptoms.  Your medical history.  CT scan of the brain.  MRI of the brain.  Magnetic resonance venogram (MRV) to check veins in the brain.  Diagnostic lumbar puncture. This is a procedure to remove and examine a sample of cerebrospinal fluid. This procedure can determine whether too much fluid may be causing IIH.  A thorough eye exam to check for swelling or nerve damage in the eyes.  How is this treated? Treatment for this condition depends on your symptoms. The goal of treatment is to decrease the pressure around your brain. Common treatments include:  Medicines to decrease the production of spinal fluid and lower the pressure within your skull.  Medicines to prevent or treat headaches.  Surgery to place drains (shunts) in your brain to remove excess fluid.  Lumbar puncture to remove excess cerebrospinal  fluid.  Follow these instructions at home:  If you are overweight or obese, work with your health care provider to lose weight.  Take over-the-counter and prescription medicines only as told by your health care provider.  Do not drive or use heavy machinery while taking medicines that can make you sleepy.  Keep all follow-up visits as told by your health care provider. This is important. Contact a health care provider if:  You have changes in your vision, such as: ? Double vision. ? Not being able to see colors (color vision). Get help right away if:  You have any of the following symptoms and they get worse or do not get better. ? Headaches. ? Nausea. ? Vomiting. ? Vision changes or difficulty seeing. Summary  Idiopathic intracranial hypertension (IIH) is a condition that increases pressure around the brain. The cause is not known (is idiopathic).  The most common symptom of IIH is headaches.  Treatment may include medicines or surgery to relieve the pressure on your brain. This information is not intended to replace advice given to you by your health care provider. Make sure you discuss any questions you have with your health care provider. Document Released: 09/27/2001 Document Revised: 06/09/2016 Document Reviewed: 06/09/2016 Elsevier Interactive Patient Education  2017 Elsevier Inc.  Acetazolamide tablets What is this medicine? ACETAZOLAMIDE (a set a ZOLE a mide) is used to treat glaucoma and some seizure disorders. It may be used to treat edema or swelling from heart failure or from other medicines. This medicine is also used to treat and to prevent altitude or mountain sickness. This medicine may be used for  other purposes; ask your health care provider or pharmacist if you have questions. COMMON BRAND NAME(S): Diamox What should I tell my health care provider before I take this medicine? They need to know if you have any of these conditions: -diabetes -kidney  disease -liver disease -lung disease -an unusual or allergic reaction to acetazolamide, sulfa drugs, other medicines, foods, dyes, or preservatives -pregnant or trying to get pregnant -breast-feeding How should I use this medicine? Take this medicine by mouth with a glass of water. Follow the directions on the prescription label. Take this medicine with food if it upsets your stomach. Take your doses at regular intervals. Do not take your medicine more often than directed. Do not stop taking except on your doctor's advice. Talk to your pediatrician regarding the use of this medicine in children. Special care may be needed. Patients over 58 years old may have a stronger reaction and need a smaller dose. Overdosage: If you think you have taken too much of this medicine contact a poison control center or emergency room at once. NOTE: This medicine is only for you. Do not share this medicine with others. What if I miss a dose? If you miss a dose, take it as soon as you can. If it is almost time for your next dose, take only that dose. Do not take double or extra doses. What may interact with this medicine? Do not take this medicine with any of the following medications: -methazolamide This medicine may also interact with the following medications: -aspirin and aspirin-like medicines -cyclosporine -lithium -medicine for diabetes -methenamine -other diuretics -phenytoin -primidone -quinidine -sodium bicarbonate -stimulant medicines like dextroamphetamine This list may not describe all possible interactions. Give your health care provider a list of all the medicines, herbs, non-prescription drugs, or dietary supplements you use. Also tell them if you smoke, drink alcohol, or use illegal drugs. Some items may interact with your medicine. What should I watch for while using this medicine? Visit your doctor or health care professional for regular checks on your progress. You will need blood work  done regularly. If you are diabetic, check your blood sugar as directed. You may need to be on a special diet while taking this medicine. Ask your doctor. Also, ask how many glasses of fluid you need to drink a day. You must not get dehydrated. You may get drowsy or dizzy. Do not drive, use machinery, or do anything that needs mental alertness until you know how this medicine affects you. Do not stand or sit up quickly, especially if you are an older patient. This reduces the risk of dizzy or fainting spells. This medicine can make you more sensitive to the sun. Keep out of the sun. If you cannot avoid being in the sun, wear protective clothing and use sunscreen. Do not use sun lamps or tanning beds/booths. What side effects may I notice from receiving this medicine? Side effects that you should report to your doctor or health care professional as soon as possible: -allergic reactions like skin rash, itching or hives, swelling of the face, lips, or tongue -breathing problems -confusion, depression -dark urine -fever -numbness, tingling in hands or feet -redness, blistering, peeling or loosening of the skin, including inside the mouth -ringing in the ears -seizures -unusually weak or tired -yellowing of the eyes or skin Side effects that usually do not require medical attention (report to your doctor or health care professional if they continue or are bothersome): -change in taste -diarrhea -headache -loss of  appetite -nausea, vomiting -passing urine more often This list may not describe all possible side effects. Call your doctor for medical advice about side effects. You may report side effects to FDA at 1-800-FDA-1088. Where should I keep my medicine? Keep out of the reach of children. Store at room temperature between 20 and 25 degrees C (68 and 77 degrees F). Throw away any unused medicine after the expiration date. NOTE: This sheet is a summary. It may not cover all possible  information. If you have questions about this medicine, talk to your doctor, pharmacist, or health care provider.  2018 Elsevier/Gold Standard (2007-10-11 10:59:40)

## 2017-10-18 NOTE — Progress Notes (Signed)
[facility]    Provider:  Dr Lucia Gaskins Referring Provider: Elwin Mocha* Primary Care Physician:  Georges Mouse  CC:  Intracranial HTN  HPI:  Tracy Haynes is a 27 y.o. female here as a referral from Dr. Sherryll Burger for Intracranial HTN.  She started having headaches several months ago, she saw her eye doctor who is an optometrist and she was sent to ophthalmology. He noticed swelling in the back of the eyes. She has headaches that are pressure every day in the front of the head. No vision changes or episodes of blurry vision. No hx of intrauterine birth control, no hx of long term antibitotics, steroids or retin a/vita/accutane. Headaches are daily and last all day, worse in the morning when she wakes up.5/10 in pain continuous all day long, staying stable, no hearing changes or pulsating in the ears. Nothing makes it better, has tried tylenol and OTC medications. She has gained weight recently. She has eye pain like something is in her right eye. No other focal neurologic deficits, associated symptoms, inciting events or modifiable factors.  Reviewed notes, labs and imaging from outside physicians, which showed:  Reviewed primary care notes.  She is being sent here by [facility] for intracranial hypertension in the right eye and left eye.  Symptoms have been going on for about 3 weeks and her vision gets blurry at times and she gets very lightheaded.  She has headaches, Nexplanon implanted 2016, 3 weeks ago she had sudden onset of headaches without recent head trauma, no infections no history of blood clots no acne medications no prednisone no recent tick bites but she did find a tick in her bed may be 1-2 weeks ago no family history of SLE, sarcoidosis or MS.  Denies pulsatile tinnitus, numbness, weakness or tingling.  Reviewed exam which was unremarkable except for likely papilledema.  Visual fields were unremarkable (reviewed data and images from [employer])  Review of Systems: Patient complains of symptoms per HPI as well as the following symptoms: headache. Pertinent negatives and positives per HPI. All others negative.   Social History   Socioeconomic History  . Marital status: Single    Spouse name: Not on file  . Number of children: 1  . Years of education: Not on file  . Highest education level: Master's degree (e.g., MA, MS, MEng, MEd, MSW, MBA)  Social Needs  . Financial resource strain: Not on file  . Food insecurity - worry: Not on file  . Food insecurity - inability: Not on file  . Transportation needs - medical: Not on file  . Transportation needs - non-medical: Not on file  Occupational History  . Not on file  Tobacco Use  . Smoking status: Never Smoker  . Smokeless tobacco: Never Used  Substance and Sexual Activity  . Alcohol use: No    Frequency: Never    Comment: quit 2018  . Drug use: No  . Sexual activity: Yes    Birth control/protection: Implant    Comment: NEXPLANON  Other Topics Concern  . Not on file  Social History Narrative   Lives at home with her daughter   Right handed   Quit caffeine February 2019.    Family History  Problem Relation Age of Onset  . Hypertension Mother   . Arthritis Mother   . Thyroid disease Mother   . Asthma Brother   . Thalassemia Brother   . Alcohol abuse Father   . Diabetes Maternal Aunt   .  Anesthesia problems Neg Hx     Past Medical History:  Diagnosis Date  . Chlamydia infection   . Gonorrhea   . H/O varicella   . Hx: UTI (urinary tract infection)     Past Surgical History:  Procedure Laterality Date  . WISDOM TOOTH EXTRACTION      Current Outpatient Medications  Medication Sig Dispense Refill  . etonogestrel (NEXPLANON) 68 MG IMPL implant Inject 1 each into the skin once. 2013 2016 Due again 2019    . acetaZOLAMIDE (DIAMOX) 250 MG tablet Take 1 tablet (250 mg total) by mouth 2 (two) times daily. 60 tablet 6   No current  facility-administered medications for this visit.     Allergies as of 10/18/2017 - Review Complete 10/18/2017  Allergen Reaction Noted  . Blueberry [vaccinium angustifolium] Nausea And Vomiting 10/24/2012    Vitals: BP 106/65 (BP Location: Right Arm, Patient Position: Sitting)   Pulse 62   Ht 5\' 1"  (1.549 m)   Wt 156 lb 3.2 oz (70.9 kg)   BMI 29.51 kg/m  Last Weight:  Wt Readings from Last 1 Encounters:  10/18/17 156 lb 3.2 oz (70.9 kg)   Last Height:   Ht Readings from Last 1 Encounters:  10/18/17 5\' 1"  (1.549 m)    Physical exam: Exam: Gen: NAD, conversant, well nourised, obese, well groomed                     CV: RRR, no MRG. No Carotid Bruits. No peripheral edema, warm, nontender Eyes: Conjunctivae clear without exudates or hemorrhage  Neuro: Detailed Neurologic Exam  Speech:    Speech is normal; fluent and spontaneous with normal comprehension.  Cognition:    The patient is oriented to person, place, and time;     recent and remote memory intact;     language fluent;     normal attention, concentration,     fund of knowledge Cranial Nerves:    The pupils are equal, round, and reactive to light. Nasal blurring left > right. Visual fields are full to finger confrontation. Extraocular movements are intact. Trigeminal sensation is intact and the muscles of mastication are normal. The face is symmetric. The palate elevates in the midline. Hearing intact. Voice is normal. Shoulder shrug is normal. The tongue has normal motion without fasciculations.   Coordination:    Normal finger to nose and heel to shin. Normal rapid alternating movements.   Gait:    Heel-toe and tandem gait are normal.   Motor Observation:    No asymmetry, no atrophy, and no involuntary movements noted. Tone:    Normal muscle tone.    Posture:    Posture is normal. normal erect    Strength:    Strength is V/V in the upper and lower limbs.      Sensation: intact to LT     Reflex  Exam:  DTR's:    Deep tendon reflexes in the upper and lower extremities are normal bilaterally.   Toes:    The toes are downgoing bilaterally.   Clonus:    Clonus is absent.      Assessment/Plan: This is a 27 year old female here for evaluation of optic nerve head edema.  She has a past medical history of obesity.  Patient has daily continuous positional headaches.  And although she does not report significant vision changes she does report pain in the right eye.  Likely idiopathic intracranial hypertension however need to evaluate for other causes such as  space-occupying mass, orbital pseudotumor, venous thrombosis given patient is on birth control pills and obese which are risk factors for cerebral venous thrombosis.  MRI brain w/wo contrast MRI orbits w/wo contrast MRV of the brain Lumbar puncture Will order Diamox patient is instructed not to start until after lumbar puncture.  Discussed IIH, its association with weight loss and possible permanent vision loss.  If symptoms change or patient has significant worsening she is to proceed to the emergency room immediately.   Orders Placed This Encounter  Procedures  . MR BRAIN W WO CONTRAST  . MR ORBITS W WO CONTRAST  . MR MRV HEAD WO CM  . DG FLUORO GUIDED LOC OF NEEDLE/CATH TIP FOR SPINAL INJECT LT  . Comprehensive metabolic panel     Naomie DeanAntonia Mindee Robledo, MD  [facility] [address] [place], [place] [id number]  Phone [phone] Fax [phone]

## 2017-10-18 NOTE — Telephone Encounter (Signed)
Pt returned Emily's call °

## 2017-10-19 ENCOUNTER — Telehealth: Payer: Self-pay | Admitting: Neurology

## 2017-10-19 LAB — COMPREHENSIVE METABOLIC PANEL
A/G RATIO: 1.3 (ref 1.2–2.2)
ALK PHOS: 53 IU/L (ref 39–117)
ALT: 15 IU/L (ref 0–32)
AST: 16 IU/L (ref 0–40)
Albumin: 4.1 g/dL (ref 3.5–5.5)
BUN/Creatinine Ratio: 22 (ref 9–23)
BUN: 13 mg/dL (ref 6–20)
CHLORIDE: 105 mmol/L (ref 96–106)
CO2: 22 mmol/L (ref 20–29)
Calcium: 9.5 mg/dL (ref 8.7–10.2)
Creatinine, Ser: 0.59 mg/dL (ref 0.57–1.00)
GFR calc non Af Amer: 129 mL/min/{1.73_m2} (ref >59)
GFR, EST AFRICAN AMERICAN: 148 mL/min/{1.73_m2} (ref >59)
GLUCOSE: 71 mg/dL (ref 65–99)
Globulin, Total: 3.1 g/dL (ref 1.5–4.5)
POTASSIUM: 5.1 mmol/L (ref 3.5–5.2)
Sodium: 140 mmol/L (ref 134–144)
Total Protein: 7.2 g/dL (ref 6.0–8.5)

## 2017-10-19 NOTE — Telephone Encounter (Signed)
-----   Message from Anson FretAntonia B Ahern, MD sent at 10/19/2017  8:47 AM EDT ----- Labs normal

## 2017-10-19 NOTE — Telephone Encounter (Signed)
Called the patient and made her aware of the lab work. Pt verbalized understanding. Pt had no questions at this time but was encouraged to call back if questions arise.  

## 2017-10-26 ENCOUNTER — Ambulatory Visit: Payer: BLUE CROSS/BLUE SHIELD

## 2017-10-26 DIAGNOSIS — H05111 Granuloma of right orbit: Secondary | ICD-10-CM

## 2017-10-26 DIAGNOSIS — G08 Intracranial and intraspinal phlebitis and thrombophlebitis: Secondary | ICD-10-CM | POA: Diagnosis not present

## 2017-10-26 DIAGNOSIS — R51 Headache with orthostatic component, not elsewhere classified: Secondary | ICD-10-CM

## 2017-10-26 DIAGNOSIS — H4710 Unspecified papilledema: Secondary | ICD-10-CM | POA: Diagnosis not present

## 2017-10-26 DIAGNOSIS — G932 Benign intracranial hypertension: Secondary | ICD-10-CM

## 2017-10-26 DIAGNOSIS — G8929 Other chronic pain: Secondary | ICD-10-CM

## 2017-10-26 MED ORDER — GADOPENTETATE DIMEGLUMINE 469.01 MG/ML IV SOLN
15.0000 mL | Freq: Once | INTRAVENOUS | Status: AC | PRN
  Administered 2017-10-26: 15 mL via INTRAVENOUS

## 2017-10-27 DIAGNOSIS — Z3046 Encounter for surveillance of implantable subdermal contraceptive: Secondary | ICD-10-CM | POA: Diagnosis not present

## 2017-10-27 DIAGNOSIS — Z113 Encounter for screening for infections with a predominantly sexual mode of transmission: Secondary | ICD-10-CM | POA: Diagnosis not present

## 2017-10-27 DIAGNOSIS — Z114 Encounter for screening for human immunodeficiency virus [HIV]: Secondary | ICD-10-CM | POA: Diagnosis not present

## 2017-10-27 DIAGNOSIS — N760 Acute vaginitis: Secondary | ICD-10-CM | POA: Diagnosis not present

## 2017-10-31 ENCOUNTER — Ambulatory Visit
Admission: RE | Admit: 2017-10-31 | Discharge: 2017-10-31 | Disposition: A | Discharge status: Home / Self Care | Payer: BLUE CROSS/BLUE SHIELD | Source: Ambulatory Visit | Attending: Neurology | Admitting: Neurology

## 2017-10-31 ENCOUNTER — Telehealth: Payer: Self-pay | Admitting: *Deleted

## 2017-10-31 DIAGNOSIS — G932 Benign intracranial hypertension: Secondary | ICD-10-CM | POA: Diagnosis not present

## 2017-10-31 LAB — CSF CELL COUNT WITH DIFFERENTIAL
RBC COUNT CSF: 0 {cells}/uL (ref 0–10)
WBC CSF: 1 {cells}/uL (ref 0–5)

## 2017-10-31 LAB — GLUCOSE, CSF: Glucose, CSF: 69 mg/dL (ref 40–80)

## 2017-10-31 LAB — PROTEIN, CSF: Total Protein, CSF: 21 mg/dL (ref 15–45)

## 2017-10-31 NOTE — Telephone Encounter (Addendum)
Spoke with patient. She is aware that her MRV was unremarkable and her MRI brain shows no masses or lesions. It is consistent with Idiopathic Intracranial Hypertension. RN inquired if pt had received her LP today and pt confirmed and she is doing well. She is aware that likely Dr. Lucia GaskinsAhern will have her start the Diamox and that she will receive a call with her LP results within the next few days. She had no questions or concerns.  ----- Message from Anson FretAntonia B Ahern, MD sent at 10/28/2017 10:58 AM EDT ----- Theda SersUnremarkable MRV  Notes recorded by Anson FretAhern, Antonia B, MD on 10/28/2017 at 10:57 AM EDT MRI brain shows no masses or lesions. It is consistent with Idiopathic Intracranial Hypertension. Please ensure the LP is scheduled and after that we will likely start the diamox we discussed thanks

## 2017-10-31 NOTE — Discharge Instructions (Signed)

## 2017-11-01 ENCOUNTER — Telehealth: Payer: Self-pay | Admitting: *Deleted

## 2017-11-01 ENCOUNTER — Other Ambulatory Visit: Payer: Self-pay | Admitting: Neurology

## 2017-11-01 DIAGNOSIS — G8929 Other chronic pain: Secondary | ICD-10-CM

## 2017-11-01 DIAGNOSIS — G932 Benign intracranial hypertension: Secondary | ICD-10-CM

## 2017-11-01 DIAGNOSIS — R51 Headache: Secondary | ICD-10-CM

## 2017-11-01 MED ORDER — ACETAZOLAMIDE 250 MG PO TABS: 250.0000 mg | ORAL_TABLET | Freq: Two times a day (BID) | ORAL | 6 refills | Status: DC

## 2017-11-01 NOTE — Telephone Encounter (Signed)
-----   Message from Antonia B Ahern, MD sent at 11/01/2017 12:47 PM EDT ----- Opening pressure was elevated. Calling in Diamox as we discussed ather appointment thanks 

## 2017-11-01 NOTE — Telephone Encounter (Signed)
Pt returning RNs call aware of what had been noted °

## 2017-11-01 NOTE — Telephone Encounter (Signed)
Spoke with patient re: LP results. She is aware that the opening pressure was elevated. Dr. Lucia GaskinsAhern stated to start Diamox. The order is for 250 mg oral BID. She was advised on a couple of common side effects including tingling in fingers/toes and sodas tasting flat or funny. Encouraged patient to research full list of side effects and ask pharmacist any questions upon pickup of medication. Her questions were answered. Pt also asked for any specific instructions r/t caffeine intake. She verbalized understanding and appreciation.

## 2017-11-01 NOTE — Telephone Encounter (Signed)
-----   Message from Anson FretAntonia B Ahern, MD sent at 11/01/2017 12:47 PM EDT ----- Opening pressure was elevated. Calling in Diamox as we discussed ather appointment thanks

## 2017-11-01 NOTE — Telephone Encounter (Signed)
Called pt & LVM asking for call back.   When she calls back, please tell her that Dr. Lucia GaskinsAhern said to limit to 1-2 drinks a day with caffeine.

## 2017-11-01 NOTE — Telephone Encounter (Signed)
Limit to 1-2 drinks a day with caffeine thanks

## 2017-11-01 NOTE — Telephone Encounter (Signed)
Called pt & LVM asking for call back @ 2:38 pm on 11/01/2017.

## 2017-11-01 NOTE — Telephone Encounter (Signed)
Called pt & LVM asking for call back.  

## 2017-11-03 NOTE — Telephone Encounter (Signed)
Pt called she's still experiencing a HA.  Pt also is wanting to know if there are any issues with taking diamox while on flagyl. Please call at [phone] to advise

## 2017-11-03 NOTE — Telephone Encounter (Signed)
Spoke with patient. She stated that she a had a little headache right after the LP (on Monday) but it went away the day after. Wednesday the headache came back and it is not like her usual headaches. It is worse and it gets better when she lays down although does not completely go away. Patients wants to take some Aleve. Discussed possible need for blood patch but will discuss with Dr. Lucia GaskinsAhern and call pt back. She verbalized appreciation. No drug-drug interactions seen for Flagyl. Plus, pt said she had asked the pharmacy already and they said it was fine to take them both together.

## 2017-11-03 NOTE — Telephone Encounter (Addendum)
Spoke with Dr. Lucia GaskinsAhern, suggest pt try the Ibuprofen for now and then call back tomorrow if not better.   Called pt back and discussed the above. She verbalized appreciation and would rather do that then have another procedure right now. She will call back if it does not get better.

## 2017-11-04 ENCOUNTER — Encounter (HOSPITAL_COMMUNITY): Payer: Self-pay | Admitting: *Deleted

## 2017-11-04 ENCOUNTER — Other Ambulatory Visit: Payer: Self-pay

## 2017-11-04 ENCOUNTER — Emergency Department (HOSPITAL_COMMUNITY)
Admission: EM | Admit: 2017-11-04 | Discharge: 2017-11-04 | Disposition: A | Discharge status: Home / Self Care | Payer: BLUE CROSS/BLUE SHIELD | Attending: Emergency Medicine | Admitting: Emergency Medicine

## 2017-11-04 DIAGNOSIS — N898 Other specified noninflammatory disorders of vagina: Secondary | ICD-10-CM | POA: Insufficient documentation

## 2017-11-04 DIAGNOSIS — R51 Headache: Secondary | ICD-10-CM | POA: Diagnosis not present

## 2017-11-04 DIAGNOSIS — Z79899 Other long term (current) drug therapy: Secondary | ICD-10-CM | POA: Insufficient documentation

## 2017-11-04 DIAGNOSIS — G43909 Migraine, unspecified, not intractable, without status migrainosus: Secondary | ICD-10-CM | POA: Diagnosis not present

## 2017-11-04 LAB — URINALYSIS, ROUTINE W REFLEX MICROSCOPIC
Bilirubin Urine: NEGATIVE
GLUCOSE, UA: NEGATIVE mg/dL
HGB URINE DIPSTICK: NEGATIVE
KETONES UR: NEGATIVE mg/dL
NITRITE: NEGATIVE
PROTEIN: NEGATIVE mg/dL
Specific Gravity, Urine: 1.023 (ref 1.005–1.030)
pH: 7 (ref 5.0–8.0)

## 2017-11-04 LAB — COMPREHENSIVE METABOLIC PANEL
ALBUMIN: 4 g/dL (ref 3.5–5.0)
ALT: 29 U/L (ref 14–54)
ANION GAP: 9 (ref 5–15)
AST: 27 U/L (ref 15–41)
Alkaline Phosphatase: 44 U/L (ref 38–126)
BUN: 8 mg/dL (ref 6–20)
CHLORIDE: 110 mmol/L (ref 101–111)
CO2: 18 mmol/L — ABNORMAL LOW (ref 22–32)
Calcium: 9.5 mg/dL (ref 8.9–10.3)
Creatinine, Ser: 0.77 mg/dL (ref 0.44–1.00)
GFR calc Af Amer: >60 mL/min (ref >60)
GFR calc non Af Amer: >60 mL/min (ref >60)
GLUCOSE: 103 mg/dL — AB (ref 65–99)
POTASSIUM: 4.5 mmol/L (ref 3.5–5.1)
SODIUM: 137 mmol/L (ref 135–145)
TOTAL PROTEIN: 7.9 g/dL (ref 6.5–8.1)
Total Bilirubin: 0.5 mg/dL (ref 0.3–1.2)

## 2017-11-04 LAB — CBC
HEMATOCRIT: 42.5 % (ref 36.0–46.0)
HEMOGLOBIN: 14 g/dL (ref 12.0–15.0)
MCH: 28.2 pg (ref 26.0–34.0)
MCHC: 32.9 g/dL (ref 30.0–36.0)
MCV: 85.7 fL (ref 78.0–100.0)
Platelets: 334 10*3/uL (ref 150–400)
RBC: 4.96 MIL/uL (ref 3.87–5.11)
RDW: 13.3 % (ref 11.5–15.5)
WBC: 13 10*3/uL — ABNORMAL HIGH (ref 4.0–10.5)

## 2017-11-04 LAB — I-STAT BETA HCG BLOOD, ED (MC, WL, AP ONLY)

## 2017-11-04 LAB — LIPASE, BLOOD: LIPASE: 29 U/L (ref 11–51)

## 2017-11-04 MED ORDER — DEXAMETHASONE SODIUM PHOSPHATE 10 MG/ML IJ SOLN
10.0000 mg | Freq: Once | INTRAMUSCULAR | Status: AC
  Administered 2017-11-04: 10 mg via INTRAVENOUS
  Filled 2017-11-04: qty 1

## 2017-11-04 MED ORDER — KETOROLAC TROMETHAMINE 30 MG/ML IJ SOLN
30.0000 mg | Freq: Once | INTRAMUSCULAR | Status: AC
  Administered 2017-11-04: 30 mg via INTRAVENOUS
  Filled 2017-11-04: qty 1

## 2017-11-04 MED ORDER — SODIUM CHLORIDE 0.9 % IV BOLUS
1000.0000 mL | Freq: Once | INTRAVENOUS | Status: AC
  Administered 2017-11-04: 1000 mL via INTRAVENOUS

## 2017-11-04 MED ORDER — DIPHENHYDRAMINE HCL 50 MG/ML IJ SOLN
25.0000 mg | Freq: Once | INTRAMUSCULAR | Status: AC
  Administered 2017-11-04: 25 mg via INTRAVENOUS
  Filled 2017-11-04: qty 1

## 2017-11-04 MED ORDER — PROCHLORPERAZINE EDISYLATE 5 MG/ML IJ SOLN
10.0000 mg | Freq: Once | INTRAMUSCULAR | Status: AC
  Administered 2017-11-04: 10 mg via INTRAVENOUS
  Filled 2017-11-04: qty 2

## 2017-11-04 NOTE — Discharge Instructions (Addendum)
Please rest when he gets home.  Stay well-hydrated drink plenty of water and Gatorade.  He may take Tylenol 650 mg every 6 hours as needed for headache.  Please call your neurologist to arrange follow-up.

## 2017-11-04 NOTE — ED Provider Notes (Signed)
I have personally seen and examined the patient. I have reviewed the documentation on PMH/FH/Soc Hx. I have discussed the plan of care with the resident and patient.  I have reviewed and agree with the resident's documentation. Please see associated encounter note.  Briefly, the patient is a 27 y.o. female here with headache following LP 5 days ago and diagnosed with IIH, currently on Diamox. Non focal neuro exam. No recent head trauma. No fever. Doubt meningitis. Doubt intracranial bleed.  No indication for imaging. Will treat with migraine cocktail and reevaluate.  Significant improvement following cocktail.  The patient is safe for discharge with strict return precautions.    EKG Interpretation None         Vernestine Brodhead, Amadeo GarnetPedro Eduardo, MD 11/04/17 [phone]

## 2017-11-04 NOTE — ED Provider Notes (Signed)
[facility] EMERGENCY DEPARTMENT Provider Note   CSN: [id number] Arrival date & time: 11/04/17  1019     History   Chief Complaint Chief Complaint  Patient presents with  . Headache  . Emesis    HPI Tracy Haynes is a 27 y.o. female.  The history is provided by the patient and medical records.  Headache   This is a recurrent problem. The current episode started more than 2 days ago. The problem occurs constantly. The problem has been gradually worsening. The headache is associated with bright light and loud noise (no HA after LP on Monday till Wednesday). The pain is located in the frontal region. The quality of the pain is described as dull. The pain is at a severity of 8/10. The pain is severe. The pain does not radiate. Associated symptoms include nausea and vomiting. Pertinent negatives include no fever, no malaise/fatigue, no orthopnea, no palpitations, no syncope and no shortness of breath. She has tried acetaminophen for the symptoms. The treatment provided no relief.    Past Medical History:  Diagnosis Date  . Chlamydia infection   . Gonorrhea   . H/O varicella   . Hx: UTI (urinary tract infection)     Patient Active Problem List   Diagnosis Date Noted  . Headache 10/18/2017  . Vaginal laceration 02/09/2012  . Normal pregnancy, first 11/17/2011  . Gonorrhea 11/11/2011  . Chlamydia infection, current pregnancy 11/11/2011    Past Surgical History:  Procedure Laterality Date  . WISDOM TOOTH EXTRACTION       OB History    Gravida  1   Para  1   Term  1   Preterm  0   AB  0   Living  1     SAB  0   TAB  0   Ectopic  0   Multiple  0   Live Births  1            Home Medications    Prior to Admission medications   Medication Sig Start Date End Date Taking? Authorizing Provider  acetaZOLAMIDE (DIAMOX) 250 MG tablet Take 1 tablet (250 mg total) by mouth 2 (two) times daily. 11/01/17   Anson Fret, MD  etonogestrel  (NEXPLANON) 68 MG IMPL implant Inject 1 each into the skin once. 2013 2016 Due again 2019    [provider]    Family History Family History  Problem Relation Age of Onset  . Hypertension Mother   . Arthritis Mother   . Thyroid disease Mother   . Asthma Brother   . Thalassemia Brother   . Alcohol abuse Father   . Diabetes Maternal Aunt   . Anesthesia problems Neg Hx     Social History Social History   Tobacco Use  . Smoking status: Never Smoker  . Smokeless tobacco: Never Used  Substance Use Topics  . Alcohol use: No    Frequency: Never    Comment: quit 2018  . Drug use: No     Allergies   Blueberry [vaccinium angustifolium]   Review of Systems Review of Systems  Constitutional: Negative for chills, fever and malaise/fatigue.  HENT: Negative for ear pain and sore throat.   Eyes: Positive for photophobia. Negative for pain and visual disturbance.  Respiratory: Negative for cough and shortness of breath.   Cardiovascular: Negative for chest pain, palpitations, orthopnea and syncope.  Gastrointestinal: Positive for nausea and vomiting. Negative for abdominal pain and diarrhea.  Genitourinary: Positive  for vaginal discharge (being treated for BV). Negative for dysuria and hematuria.  Musculoskeletal: Negative for back pain and neck pain.  Skin: Negative for color change and rash.  Neurological: Positive for headaches. Negative for seizures and weakness.  Psychiatric/Behavioral: Negative for confusion.  All other systems reviewed and are negative.    Physical Exam Updated Vital Signs BP 120/68 (BP Location: Right Arm)   Pulse 60   Temp 97.9 F (36.6 C) (Oral)   Resp 18   SpO2 100%   Physical Exam  Constitutional: She is oriented to person, place, and time. She appears well-developed and well-nourished. No distress.  HENT:  Head: Normocephalic and atraumatic.  Eyes: Pupils are equal, round, and reactive to light. Conjunctivae and EOM are normal.  Right eye exhibits no nystagmus. Left eye exhibits no nystagmus.  Neck: Neck supple.  Cardiovascular: Normal rate and regular rhythm.  No murmur heard. Pulmonary/Chest: Effort normal and breath sounds normal. No respiratory distress. She has no wheezes. She has no rales.  Abdominal: Soft. She exhibits no distension. There is no tenderness. There is no guarding.  Musculoskeletal: She exhibits no edema.  Neurological: She is alert and oriented to person, place, and time. She has normal strength. No cranial nerve deficit or sensory deficit. Coordination and gait normal. GCS eye subscore is 4. GCS verbal subscore is 5. GCS motor subscore is 6.  Skin: Skin is warm and dry.  Psychiatric: She has a normal mood and affect.  Nursing note and vitals reviewed.    ED Treatments / Results  Labs (all labs ordered are listed, but only abnormal results are displayed) Labs Reviewed  COMPREHENSIVE METABOLIC PANEL - Abnormal; Notable for the following components:      Result Value   CO2 18 (*)    Glucose, Bld 103 (*)    All other components within normal limits  CBC - Abnormal; Notable for the following components:   WBC 13.0 (*)    All other components within normal limits  URINALYSIS, ROUTINE W REFLEX MICROSCOPIC - Abnormal; Notable for the following components:   Color, Urine AMBER (*)    APPearance CLOUDY (*)    Leukocytes, UA MODERATE (*)    Bacteria, UA RARE (*)    Squamous Epithelial / LPF TOO NUMEROUS TO COUNT (*)    All other components within normal limits  LIPASE, BLOOD  I-STAT BETA HCG BLOOD, ED ([facility], [facility], [facility] ONLY)    EKG None  Radiology No results found.  Procedures Procedures (including critical care time)  Medications Ordered in ED Medications  sodium chloride 0.9 % bolus 1,000 mL (0 mLs Intravenous Stopped 11/04/17 1645)  prochlorperazine (COMPAZINE) injection 10 mg (10 mg Intravenous Given 11/04/17 1635)  diphenhydrAMINE (BENADRYL) injection 25 mg (25 mg Intravenous Given  11/04/17 1635)  ketorolac (TORADOL) 30 MG/ML injection 30 mg (30 mg Intravenous Given 11/04/17 1635)  dexamethasone (DECADRON) injection 10 mg (10 mg Intravenous Given 11/04/17 1635)     Initial Impression / Assessment and Plan / ED Course  I have reviewed the triage vital signs and the nursing notes.  Pertinent labs & imaging results that were available during my care of the patient were reviewed by me and considered in my medical decision making (see chart for details).     Patient is a 27 year old female with history of STD, chronic headaches, recent diagnosis of idiopathic intracranial hypertension who presents with day 3 of headaches.  She states these headaches are worse than before.  Headaches were gradual onset.  She had an LP done on Monday and she states initially for the first 2 days after she did not have any post LP headache.  She also complains of multiple episodes of nausea and vomiting without any diarrhea.  She complains of mild epigastric pain as well.    On exam she is in no acute distress with normal vital signs.  She has no infectious symptoms.  She is being treated with Flagyl low for bacterial vaginosis currently.  Neurological exam is unremarkable for any abnormalities.  Given she has photophobia and phonophobia along with this prolonged headache, most likely this is migrainous in nature as opposed to post LP headache.  Abdominal exam was unremarkable.  Plan to treat with migraine cocktail and reassess.    Labs performed as above consistent with a vomiting illness with a mild leukocytosis and a bicarb of 18.  LFTs and lipase were normal.  She had moderate leukocytes in her urine but this is a dirty catch.  This is likely secondary to her bacterial vaginosis.  She has no urinary symptoms but does have vaginal area irritation.  Given she is already on the appropriate therapy for the bacterial vaginosis, will defer pelvic exam at this time.  On reassessment, patient's headache  is resolving quickly.  Patient instructed to stay well hydrated with Gatorade and water, get plenty of rest and take Tylenol as needed for her headaches.  Patient to call her neurologist to set up follow-up.  Final Clinical Impressions(s) / ED Diagnoses   Final diagnoses:  Migraine without status migrainosus, not intractable, unspecified migraine type    ED Discharge Orders    None       Dwana MelenaDong, Makayleigh Poliquin, DO 11/04/17 1701

## 2017-11-04 NOTE — ED Triage Notes (Addendum)
Pt reports headache and vomiting since Wednesday. Pt states that she is unable to tolerate PO intake. Pt had an Lumbar Puncture done on Monday and followed the 24 hr bedrest recommendations. Pt called her MD from the procedure and they do not feel this is related to the procedure.  Pt alert and oriented. NAD noted.

## 2017-11-04 NOTE — ED Notes (Signed)
Pt presents with a headache, nausea, and vomiting for 3 days. Pt reports a lumbar puncture on Monday of this week.

## 2017-11-30 DIAGNOSIS — L719 Rosacea, unspecified: Secondary | ICD-10-CM | POA: Diagnosis not present

## 2017-11-30 DIAGNOSIS — L309 Dermatitis, unspecified: Secondary | ICD-10-CM | POA: Diagnosis not present

## 2017-11-30 DIAGNOSIS — L700 Acne vulgaris: Secondary | ICD-10-CM | POA: Diagnosis not present

## 2017-11-30 DIAGNOSIS — L300 Nummular dermatitis: Secondary | ICD-10-CM | POA: Diagnosis not present

## 2017-12-06 DIAGNOSIS — Z113 Encounter for screening for infections with a predominantly sexual mode of transmission: Secondary | ICD-10-CM | POA: Diagnosis not present

## 2017-12-06 DIAGNOSIS — B373 Candidiasis of vulva and vagina: Secondary | ICD-10-CM | POA: Diagnosis not present

## 2017-12-06 DIAGNOSIS — Z3046 Encounter for surveillance of implantable subdermal contraceptive: Secondary | ICD-10-CM | POA: Diagnosis not present

## 2017-12-06 DIAGNOSIS — Z23 Encounter for immunization: Secondary | ICD-10-CM | POA: Diagnosis not present

## 2017-12-13 ENCOUNTER — Ambulatory Visit: Payer: BLUE CROSS/BLUE SHIELD | Admitting: Neurology

## 2017-12-28 DIAGNOSIS — Z3009 Encounter for other general counseling and advice on contraception: Secondary | ICD-10-CM | POA: Diagnosis not present

## 2017-12-28 DIAGNOSIS — Z3046 Encounter for surveillance of implantable subdermal contraceptive: Secondary | ICD-10-CM | POA: Diagnosis not present

## 2018-02-15 ENCOUNTER — Encounter

## 2018-02-15 ENCOUNTER — Telehealth: Payer: Self-pay | Admitting: *Deleted

## 2018-02-15 ENCOUNTER — Ambulatory Visit: Payer: BLUE CROSS/BLUE SHIELD | Admitting: Neurology

## 2018-02-15 NOTE — Telephone Encounter (Signed)
Pt no showed f/u appt 02/15/2018 @ 08:30.

## 2018-02-16 ENCOUNTER — Encounter: Payer: Self-pay | Admitting: Neurology

## 2018-03-24 ENCOUNTER — Telehealth: Payer: Self-pay | Admitting: Neurology

## 2018-03-24 NOTE — Telephone Encounter (Signed)
Called the patient in regards to her apt on 9/3 at 3 pm. Dr Lucia GaskinsAhern has to leave early this day and I need to reschedule the patient. There is a 1 pm work in slot same day that we could move her into or a office slot that is open 9/5 at 8:30 am. Please offer these times to the patient.

## 2018-04-04 ENCOUNTER — Ambulatory Visit: Payer: BLUE CROSS/BLUE SHIELD | Admitting: Neurology

## 2018-04-06 ENCOUNTER — Telehealth: Payer: Self-pay | Admitting: Neurology

## 2018-04-06 ENCOUNTER — Telehealth: Payer: Self-pay | Admitting: *Deleted

## 2018-04-06 ENCOUNTER — Ambulatory Visit: Payer: Self-pay | Admitting: Neurology

## 2018-04-06 NOTE — Telephone Encounter (Signed)
Please dismiss patient for multiple no-shows thank you

## 2018-04-06 NOTE — Telephone Encounter (Signed)
Pt no showed f/u appt on 04/06/2018 @ 8:30 AM.

## 2018-04-07 ENCOUNTER — Encounter: Payer: Self-pay | Admitting: Neurology

## 2018-04-10 ENCOUNTER — Encounter: Payer: Self-pay | Admitting: Neurology

## 2018-06-05 DIAGNOSIS — Z113 Encounter for screening for infections with a predominantly sexual mode of transmission: Secondary | ICD-10-CM | POA: Diagnosis not present

## 2018-06-05 DIAGNOSIS — N760 Acute vaginitis: Secondary | ICD-10-CM | POA: Diagnosis not present

## 2018-06-16 ENCOUNTER — Ambulatory Visit: Payer: BLUE CROSS/BLUE SHIELD | Admitting: Family Medicine

## 2018-06-16 ENCOUNTER — Encounter: Payer: Self-pay | Admitting: Family Medicine

## 2018-06-16 VITALS — BP 102/76 | HR 88 | Temp 98.5°F | Ht 61.0 in | Wt 163.0 lb

## 2018-06-16 DIAGNOSIS — Z0000 Encounter for general adult medical examination without abnormal findings: Secondary | ICD-10-CM | POA: Diagnosis not present

## 2018-06-16 DIAGNOSIS — K5900 Constipation, unspecified: Secondary | ICD-10-CM | POA: Diagnosis not present

## 2018-06-16 DIAGNOSIS — Z13220 Encounter for screening for lipoid disorders: Secondary | ICD-10-CM

## 2018-06-16 DIAGNOSIS — Z131 Encounter for screening for diabetes mellitus: Secondary | ICD-10-CM

## 2018-06-16 DIAGNOSIS — E049 Nontoxic goiter, unspecified: Secondary | ICD-10-CM | POA: Diagnosis not present

## 2018-06-16 LAB — BASIC METABOLIC PANEL
BUN: 10 mg/dL (ref 6–23)
CALCIUM: 9.7 mg/dL (ref 8.4–10.5)
CO2: 29 mEq/L (ref 19–32)
CREATININE: 0.63 mg/dL (ref 0.40–1.20)
Chloride: 103 mEq/L (ref 96–112)
GFR: 147.75 mL/min (ref >60.00)
Glucose, Bld: 82 mg/dL (ref 70–99)
Potassium: 4.8 mEq/L (ref 3.5–5.1)
Sodium: 137 mEq/L (ref 135–145)

## 2018-06-16 LAB — CBC WITH DIFFERENTIAL/PLATELET
BASOS PCT: 0.4 % (ref 0.0–3.0)
Basophils Absolute: 0 10*3/uL (ref 0.0–0.1)
Eosinophils Absolute: 0.2 10*3/uL (ref 0.0–0.7)
Eosinophils Relative: 3.1 % (ref 0.0–5.0)
HEMATOCRIT: 40.8 % (ref 36.0–46.0)
Hemoglobin: 13.5 g/dL (ref 12.0–15.0)
Lymphocytes Relative: 21.4 % (ref 12.0–46.0)
Lymphs Abs: 1.7 10*3/uL (ref 0.7–4.0)
MCHC: 33.1 g/dL (ref 30.0–36.0)
MCV: 85.5 fl (ref 78.0–100.0)
MONOS PCT: 8.6 % (ref 3.0–12.0)
Monocytes Absolute: 0.7 10*3/uL (ref 0.1–1.0)
NEUTROS ABS: 5.2 10*3/uL (ref 1.4–7.7)
Neutrophils Relative %: 66.5 % (ref 43.0–77.0)
PLATELETS: 367 10*3/uL (ref 150.0–400.0)
RBC: 4.78 Mil/uL (ref 3.87–5.11)
RDW: 12.5 % (ref 11.5–15.5)
WBC: 7.9 10*3/uL (ref 4.0–10.5)

## 2018-06-16 LAB — LIPID PANEL
CHOL/HDL RATIO: 3
Cholesterol: 182 mg/dL (ref 0–200)
HDL: 53.1 mg/dL (ref >39.00)
LDL CALC: 120 mg/dL — AB (ref 0–99)
NonHDL: 128.95
TRIGLYCERIDES: 46 mg/dL (ref 0.0–149.0)
VLDL: 9.2 mg/dL (ref 0.0–40.0)

## 2018-06-16 LAB — T4, FREE: Free T4: 0.92 ng/dL (ref 0.60–1.60)

## 2018-06-16 LAB — HEMOGLOBIN A1C: Hgb A1c MFr Bld: 5.4 % (ref 4.6–6.5)

## 2018-06-16 LAB — TSH: TSH: 2.61 u[IU]/mL (ref 0.35–4.50)

## 2018-06-16 NOTE — Patient Instructions (Addendum)
Preventive Care 18-39 Years, Female Preventive care refers to lifestyle choices and visits with your health care provider that can promote health and wellness. What does preventive care include?  A yearly physical exam. This is also called an annual well check.  Dental exams once or twice a year.  Routine eye exams. Ask your health care provider how often you should have your eyes checked.  Personal lifestyle choices, including: ? Daily care of your teeth and gums. ? Regular physical activity. ? Eating a healthy diet. ? Avoiding tobacco and drug use. ? Limiting alcohol use. ? Practicing safe sex. ? Taking vitamin and mineral supplements as recommended by your health care provider. What happens during an annual well check? The services and screenings done by your health care provider during your annual well check will depend on your age, overall health, lifestyle risk factors, and family history of disease. Counseling Your health care provider may ask you questions about your:  Alcohol use.  Tobacco use.  Drug use.  Emotional well-being.  Home and relationship well-being.  Sexual activity.  Eating habits.  Work and work Statistician.  Method of birth control.  Menstrual cycle.  Pregnancy history.  Screening You may have the following tests or measurements:  Height, weight, and BMI.  Diabetes screening. This is done by checking your blood sugar (glucose) after you have not eaten for a while (fasting).  Blood pressure.  Lipid and cholesterol levels. These may be checked every 5 years starting at age 66.  Skin check.  Hepatitis C blood test.  Hepatitis B blood test.  Sexually transmitted disease (STD) testing.  BRCA-related cancer screening. This may be done if you have a family history of breast, ovarian, tubal, or peritoneal cancers.  Pelvic exam and Pap test. This may be done every 3 years starting at age 40. Starting at age 59, this may be done every 5  years if you have a Pap test in combination with an HPV test.  Discuss your test results, treatment options, and if necessary, the need for more tests with your health care provider. Vaccines Your health care provider may recommend certain vaccines, such as:  Influenza vaccine. This is recommended every year.  Tetanus, diphtheria, and acellular pertussis (Tdap, Td) vaccine. You may need a Td booster every 10 years.  Varicella vaccine. You may need this if you have not been vaccinated.  HPV vaccine. If you are 69 or younger, you may need three doses over 6 months.  Measles, mumps, and rubella (MMR) vaccine. You may need at least one dose of MMR. You may also need a second dose.  Pneumococcal 13-valent conjugate (PCV13) vaccine. You may need this if you have certain conditions and were not previously vaccinated.  Pneumococcal polysaccharide (PPSV23) vaccine. You may need one or two doses if you smoke cigarettes or if you have certain conditions.  Meningococcal vaccine. One dose is recommended if you are age 27-21 years and a first-year college student living in a residence hall, or if you have one of several medical conditions. You may also need additional booster doses.  Hepatitis A vaccine. You may need this if you have certain conditions or if you travel or work in places where you may be exposed to hepatitis A.  Hepatitis B vaccine. You may need this if you have certain conditions or if you travel or work in places where you may be exposed to hepatitis B.  Haemophilus influenzae type b (Hib) vaccine. You may need this if  you have certain risk factors.  Talk to your health care provider about which screenings and vaccines you need and how often you need them. This information is not intended to replace advice given to you by your health care provider. Make sure you discuss any questions you have with your health care provider. Document Released: 09/14/2001 Document Revised: 04/07/2016  Document Reviewed: 05/20/2015 Elsevier Interactive Patient Education  2018 [place] A goiter is an enlarged thyroid gland. The thyroid gland is located in the lower front of the neck. The gland produces hormones that regulate mood, body temperature, pulse rate, and digestion. Most goiters are painless and are not a cause for serious concern. Goiters and conditions that cause goiters can be treated, if necessary. What are the causes? Causes of this condition include:  Diseases that attack healthy cells in your body (autoimmune diseases) and affect your thyroid function, such as: ? Graves disease. This causes too much thyroid hormone to be produced and it makes your thyroid overly active (hyperthyroidism). ? Hashimoto disease. This type of inflammation of the thyroid (thyroiditis) causes too little thyroid hormone to be produced and it makes your thyroid not active enough (hypothyroidism).  Other conditions that cause thyroiditis.  Nodular goiter. This means that there are one or more small growths on your thyroid. These can create too much thyroid hormone.  Pregnancy.  Thyroid cancer. This is rare.  Certain medicines.  Radiation exposure.  Iodine deficiency.  In some cases, the cause may not be known (idiopathic). What increases the risk? This condition is more likely to develop in:  People who have a family history of goiter.  Women.  People who do not get enough iodine in their diet.  People who are older than 90.  People who smoke tobacco.  What are the signs or symptoms? Common symptoms of this condition include:  Swelling in the lower part of the neck. This swelling can range from a very small bump to a large lump.  A tight feeling in the throat.  A hoarse voice.  Other symptoms include:  Coughing.  Wheezing.  Difficulty swallowing.  Difficulty breathing.  Bulging neck veins.  Dizziness.  In some cases, there are no symptoms and thyroid  hormone levels may be normal. When a goiter is the result of hyperthyroidism, symptoms may also include:  Nervousness or restlessness.  Inability to tolerate heat.  Unexplained weight loss.  Diarrhea.  Change in the texture of hair or skin.  Changes in heart beat, such as skipped beats, extra beats, or a rapid heart rate.  Loss of menstruation.  Shaky hands.  Increased appetite.  Sleep problems.  When a goiter is the result of hypothyroidism, symptoms may also include:  Feeling like you have no energy (lethargy).  Inability to tolerate cold.  Weight gain that is not explained by a change in diet or exercise habits.  Dry skin.  Coarse hair.  Menstrual irregularity.  Constipation.  Sadness or depression.  How is this diagnosed? This condition may be diagnosed with a medical history and physical exam. You may also have other tests, including:  Blood tests to check thyroid function.  Imaging tests, such as: ? Ultrasonography. ? CT scan. ? MRI. ? Thyroid scan. You will be given a safe radioactive injection, then images will be taken of your thyroid.  Tissue sample (biopsy) of the goiter or any nodules. This checks to see if the goiter or nodules are cancerous.  How is this treated? Treatment for  this condition depends on the cause. Treatment may include:  Medicines to control your thyroid.  Anti-inflammatory or steroid medicines, if inflammation is the cause.  Iodine supplements or changes in diet, if the goiter is caused by iodine deficiency.  Radiation therapy.  Surgery to remove your thyroid.  In some cases, no treatment is necessary, and your health care provider will monitor your condition at regular checkups. Follow these instructions at home:  Follow recommendations from your health care provider for any changes to your diet.  Take over-the-counter and prescription medicines only as told by your health care provider.  Do not use any tobacco  products, including cigarettes, chewing tobacco, or e-cigarettes. If you need help quitting, ask your health care provider.  Keep all follow-up appointments as told by your health care provider. This is important. Contact a health care provider if:  Your symptoms do not get better with treatment. Get help right away if:  You develop sudden, unexplained confusion or other mental changes.  You have nausea, vomiting, or diarrhea.  You develop a fever.  Your skin or the whites of your eyes appear yellow (jaundice).  You develop chest pain.  You have trouble breathing or swallowing.  You suddenly become very weak.  You experience extreme restlessness. This information is not intended to replace advice given to you by your health care provider. Make sure you discuss any questions you have with your health care provider. Document Released: 01/06/2010 Document Revised: 02/06/2016 Document Reviewed: 07/15/2014 Elsevier Interactive Patient Education  2018 [employer].  Constipation, Adult Constipation is when a person has fewer bowel movements in a week than normal, has difficulty having a bowel movement, or has stools that are dry, hard, or larger than normal. Constipation may be caused by an underlying condition. It may become worse with age if a person takes certain medicines and does not take in enough fluids. Follow these instructions at home: Eating and drinking   Eat foods that have a lot of fiber, such as fresh fruits and vegetables, whole grains, and beans.  Limit foods that are high in fat, low in fiber, or overly processed, such as french fries, hamburgers, cookies, candies, and soda.  Drink enough fluid to keep your urine clear or pale yellow. General instructions  Exercise regularly or as told by your health care provider.  Go to the restroom when you have the urge to go. Do not hold it in.  Take over-the-counter and prescription medicines only as told by your health  care provider. These include any fiber supplements.  Practice pelvic floor retraining exercises, such as deep breathing while relaxing the lower abdomen and pelvic floor relaxation during bowel movements.  Watch your condition for any changes.  Keep all follow-up visits as told by your health care provider. This is important. Contact a health care provider if:  You have pain that gets worse.  You have a fever.  You do not have a bowel movement after 4 days.  You vomit.  You are not hungry.  You lose weight.  You are bleeding from the anus.  You have thin, pencil-like stools. Get help right away if:  You have a fever and your symptoms suddenly get worse.  You leak stool or have blood in your stool.  Your abdomen is bloated.  You have severe pain in your abdomen.  You feel dizzy or you faint. This information is not intended to replace advice given to you by your health care provider. Make sure  you discuss any questions you have with your health care provider. Document Released: 04/16/2004 Document Revised: 02/06/2016 Document Reviewed: 01/07/2016 Elsevier Interactive Patient Education  2018 [employer].

## 2018-06-16 NOTE — Progress Notes (Signed)
Subjective:     Tracy Haynes is a 27 y.o. female and is here for a comprehensive physical exam. The patient reports no problems.   Constipation: -chronic -1 BM/wk -eating "junk", few vegetables, some fast food -endorses intermittent lower abdominal cramping  Allergies:  Blueberries-Vomiting  Social Hx: Pt has a 786 yo daughter.  Pt works at [employer] in [place] and is in school at [employer].  Pt denies EtOH, tobacco, and drug use.  Social History   Socioeconomic History  . Marital status: Single    Spouse name: Not on file  . Number of children: 1  . Years of education: Not on file  . Highest education level: Master's degree (e.g., MA, MS, MEng, MEd, MSW, MBA)  Occupational History  . Not on file  Social Needs  . Financial resource strain: Not on file  . Food insecurity:    Worry: Not on file    Inability: Not on file  . Transportation needs:    Medical: Not on file    Non-medical: Not on file  Tobacco Use  . Smoking status: Never Smoker  . Smokeless tobacco: Never Used  Substance and Sexual Activity  . Alcohol use: No    Frequency: Never    Comment: quit 2018  . Drug use: No  . Sexual activity: Yes    Birth control/protection: Implant    Comment: NEXPLANON  Lifestyle  . Physical activity:    Days per week: Not on file    Minutes per session: Not on file  . Stress: Not on file  Relationships  . Social connections:    Talks on phone: Not on file    Gets together: Not on file    Attends religious service: Not on file    Active member of club or organization: Not on file    Attends meetings of clubs or organizations: Not on file    Relationship status: Not on file  . Intimate partner violence:    Fear of current or ex partner: Not on file    Emotionally abused: Not on file    Physically abused: Not on file    Forced sexual activity: Not on file  Other Topics Concern  . Not on file  Social History Narrative   Lives at home with her daughter   Right handed   Quit caffeine February 2019.   Health Maintenance  Topic Date Due  . PAP SMEAR  03/09/2014  . INFLUENZA VACCINE  03/02/2018  . TETANUS/TDAP  12/17/2024  . HIV Screening  Completed    The following portions of the patient's history were reviewed and updated as appropriate: allergies, current medications, past family history, past medical history, past social history, past surgical history and problem list.  Review of Systems Pertinent items noted in HPI and remainder of comprehensive ROS otherwise negative.   Objective:    BP 102/76 (BP Location: Left Arm, Patient Position: Sitting, Cuff Size: Normal)   Pulse 88   Temp 98.5 F (36.9 C) (Oral)   Ht 5\' 1"  (1.549 m)   Wt 163 lb (73.9 kg)   SpO2 97%   BMI 30.80 kg/m  General appearance: alert, cooperative and no distress Head: Normocephalic, without obvious abnormality, atraumatic Eyes: conjunctivae/corneas clear. PERRL, EOM's intact. Fundi benign. Ears: normal TM's and external ear canals both ears Nose: Nares normal. Septum midline. Mucosa normal. No drainage or sinus tenderness. Throat: lips, mucosa, and tongue normal; teeth and gums normal Neck: no adenopathy, no carotid bruit, no JVD,  supple, symmetrical, trachea midline.  thyroid enlarged R>L, symmetric, no tenderness/mass/nodules Lungs: clear to auscultation bilaterally Heart: regular rate and rhythm, S1, S2 normal, no murmur, click, rub or gallop Abdomen: soft, non-tender; bowel sounds normal; no masses,  no organomegaly Extremities: extremities normal, atraumatic, no cyanosis or edema Skin: Skin color, texture, turgor normal. No rashes or lesions Neurologic: Alert and oriented X 3, normal strength and tone. Normal symmetric reflexes. Normal coordination and gait    Assessment:    Healthy female exam with finding of goiter.     Plan:     Anticipatory guidance given including wearing seatbelts, smoke detectors in the home, increasing physical activity, increasing p.o.  intake of water and vegetables. -labs ordered CBC, BMP, hemoglobin A1c, TSH, free T4, lipid panel -Influenza vaccine up-to-date -Pt to make appointment for Pap. See After Visit Summary for Counseling Recommendations    Goiter -TSH, free T4 -given handout  F/u prn  Abbe Amsterdam, MD

## 2018-06-22 ENCOUNTER — Telehealth: Payer: Self-pay | Admitting: Family Medicine

## 2018-06-22 NOTE — Telephone Encounter (Signed)
See result note.  

## 2018-06-22 NOTE — Telephone Encounter (Signed)
Pt called back.  Copied from CRM [phone]. Topic: Quick Communication - Lab Results (Clinic Use ONLY) >> Jun 21, 2018 10:51 AM Carola RhineKigotho, Nancy N, CMA wrote: Called patient to inform them of  lab results. When patient returns call, triage nurse may disclose results.

## 2018-07-10 ENCOUNTER — Ambulatory Visit: Payer: BLUE CROSS/BLUE SHIELD | Admitting: Family Medicine

## 2018-07-10 DIAGNOSIS — Z0289 Encounter for other administrative examinations: Secondary | ICD-10-CM

## 2018-07-18 ENCOUNTER — Other Ambulatory Visit (HOSPITAL_COMMUNITY)
Admission: RE | Admit: 2018-07-18 | Discharge: 2018-07-18 | Disposition: A | Discharge status: Home / Self Care | Payer: BLUE CROSS/BLUE SHIELD | Source: Ambulatory Visit | Attending: Family Medicine | Admitting: Family Medicine

## 2018-07-18 ENCOUNTER — Ambulatory Visit: Payer: BLUE CROSS/BLUE SHIELD | Admitting: Family Medicine

## 2018-07-18 ENCOUNTER — Encounter: Payer: Self-pay | Admitting: Family Medicine

## 2018-07-18 VITALS — BP 98/78 | HR 62 | Temp 97.7°F | Wt 163.0 lb

## 2018-07-18 DIAGNOSIS — Z124 Encounter for screening for malignant neoplasm of cervix: Secondary | ICD-10-CM

## 2018-07-18 NOTE — Patient Instructions (Signed)

## 2018-07-18 NOTE — Progress Notes (Signed)
Subjective:    Patient ID: Tracy Haynes, female    DOB: 11/19/1990, 27 y.o.   MRN: [id number]  No chief complaint on file.   HPI Patient was seen today for pap.  Pt had CPE 06/16/18.  Pt has never had a pap before.  Pt is doing well, has no current concerns.  Past Medical History:  Diagnosis Date  . Chlamydia infection   . Gonorrhea   . H/O varicella   . Hx: UTI (urinary tract infection)     Allergies  Allergen Reactions  . Blueberry [Vaccinium Angustifolium] Nausea And Vomiting    ROS General: Denies fever, chills, night sweats, changes in weight, changes in appetite HEENT: Denies headaches, ear pain, changes in vision, rhinorrhea, sore throat CV: Denies CP, palpitations, SOB, orthopnea Pulm: Denies SOB, cough, wheezing GI: Denies abdominal pain, nausea, vomiting, diarrhea, constipation GU: Denies dysuria, hematuria, frequency, vaginal discharge Msk: Denies muscle cramps, joint pains Neuro: Denies weakness, numbness, tingling Skin: Denies rashes, bruising Psych: Denies depression, anxiety, hallucinations    Objective:    Blood pressure 98/78, pulse 62, temperature 97.7 F (36.5 C), temperature source Oral, weight 163 lb (73.9 kg), last menstrual period 07/11/2018, SpO2 98 %.   Gen. Pleasant, well-nourished, in no distress, normal affect   HEENT: [place]/AT, face symmetric, no scleral icterus, PERRLA, nares patent without drainage Lungs: no accessory muscle use Cardiovascular: RRR, no peripheral edema GU: Normal external female genitalia.  No lesions noted.  Normal vaginal rugae and cervix.  Scant amount of white homogenous discharge noted in the cul-de-sac.  No lesions on cervix.  No CMT.  No masses appreciated.  Chaperone present: N.Kigotho, CMA Neuro:  A&Ox3, CN II-XII intact, normal gait   Wt Readings from Last 3 Encounters:  07/18/18 163 lb (73.9 kg)  06/16/18 163 lb (73.9 kg)  10/18/17 156 lb 3.2 oz (70.9 kg)    Lab Results  Component Value Date   WBC  7.9 06/16/2018   HGB 13.5 06/16/2018   HCT 40.8 06/16/2018   PLT 367.0 06/16/2018   GLUCOSE 82 06/16/2018   CHOL 182 06/16/2018   TRIG 46.0 06/16/2018   HDL 53.10 06/16/2018   LDLCALC 120 (H) 06/16/2018   ALT 29 11/04/2017   AST 27 11/04/2017   NA 137 06/16/2018   K 4.8 06/16/2018   CL 103 06/16/2018   CREATININE 0.63 06/16/2018   BUN 10 06/16/2018   CO2 29 06/16/2018   TSH 2.61 06/16/2018   HGBA1C 5.4 06/16/2018    Assessment/Plan:  Screening for cervical cancer  -given handout about cervical cancer screening. -We will also obtain testing from Pap.  Pt declines HIV and RPR at this time. - Plan: PAP [[facility]]  Follow-up PRN  Abbe AmsterdamShannon Marcelyn Ruppe, MD

## 2018-07-21 ENCOUNTER — Other Ambulatory Visit: Payer: Self-pay | Admitting: Family Medicine

## 2018-07-21 DIAGNOSIS — N760 Acute vaginitis: Principal | ICD-10-CM

## 2018-07-21 DIAGNOSIS — B9689 Other specified bacterial agents as the cause of diseases classified elsewhere: Secondary | ICD-10-CM

## 2018-07-21 LAB — CYTOLOGY - PAP
BACTERIAL VAGINITIS: POSITIVE — AB
CANDIDA VAGINITIS: NEGATIVE
Chlamydia: NEGATIVE
DIAGNOSIS: NEGATIVE
Neisseria Gonorrhea: NEGATIVE
Trichomonas: NEGATIVE

## 2018-07-21 MED ORDER — METRONIDAZOLE 500 MG PO TABS: 500.0000 mg | ORAL_TABLET | Freq: Two times a day (BID) | ORAL | 0 refills | Status: AC

## 2018-08-07 ENCOUNTER — Encounter: Payer: Self-pay | Admitting: Family Medicine

## 2018-08-07 ENCOUNTER — Ambulatory Visit: Payer: BLUE CROSS/BLUE SHIELD | Admitting: Family Medicine

## 2018-08-07 VITALS — BP 100/54 | HR 99 | Temp 98.1°F | Wt 166.2 lb

## 2018-08-07 DIAGNOSIS — B9689 Other specified bacterial agents as the cause of diseases classified elsewhere: Secondary | ICD-10-CM

## 2018-08-07 DIAGNOSIS — N760 Acute vaginitis: Secondary | ICD-10-CM | POA: Diagnosis not present

## 2018-08-07 MED ORDER — CLINDAMYCIN PHOSPHATE 2 % VA CREA: 1.0000 | TOPICAL_CREAM | Freq: Every day | VAGINAL | 1 refills | Status: DC

## 2018-08-07 NOTE — Patient Instructions (Signed)
Let us know if symptoms are not better in 2 weeks time.   Trial of acidophillus (over the counter).  Clindamycin as directed.

## 2018-08-07 NOTE — Progress Notes (Signed)
  Tracy Haynes DOB: February 14, 1991 Encounter date: 08/07/2018  This is a 28 y.o. female who presents with Chief Complaint  Patient presents with  . Follow-up    History of present illness: Was seen by Dr. Salomon Fick on 12/17 and had pap smear. Negative for intraepithelial lesions/malignancy, but positive for gardnerella. Flagyl was sent in for her.  Still noting cloudiness in urine and then odor. Didn't note any improvement when taking the flagyl. Was treated with vaginal flagyl about a month prior and didn't note improvement at that time either.   Has had issues with recurrent BV. Has done gel before and that didn't work either. Was having symptoms for about a month when she saw Dr. Salomon Fick. Not getting better while on medication.     Allergies  Allergen Reactions  . Blueberry [Vaccinium Angustifolium] Nausea And Vomiting   No outpatient medications have been marked as taking for the 08/07/18 encounter (Office Visit) with Wynn Banker, MD.    Review of Systems  Constitutional: Negative for chills, fatigue and fever.  Respiratory: Negative for cough, chest tightness, shortness of breath and wheezing.   Cardiovascular: Negative for chest pain, palpitations and leg swelling.  Genitourinary: Positive for vaginal discharge. Negative for decreased urine volume, difficulty urinating, dysuria and vaginal pain.    Objective:  BP (!) 100/54 (BP Location: Left Arm, Patient Position: Sitting, Cuff Size: Normal)   Pulse 99   Temp 98.1 F (36.7 C) (Oral)   Wt 166 lb 3.2 oz (75.4 kg)   LMP 07/11/2018 (Exact Date)   SpO2 98%   BMI 31.40 kg/m   Weight: 166 lb 3.2 oz (75.4 kg)   BP Readings from Last 3 Encounters:  08/07/18 (!) 100/54  07/18/18 98/78  06/16/18 102/76   Wt Readings from Last 3 Encounters:  08/07/18 166 lb 3.2 oz (75.4 kg)  07/18/18 163 lb (73.9 kg)  06/16/18 163 lb (73.9 kg)    Physical Exam Constitutional:      General: She is not in acute distress.  Appearance: She is well-developed.  Cardiovascular:     Rate and Rhythm: Normal rate and regular rhythm.     Heart sounds: Normal heart sounds. No murmur. No friction rub.     Comments: No lower extremity edema Pulmonary:     Effort: Pulmonary effort is normal. No respiratory distress.     Breath sounds: Normal breath sounds. No wheezing or rales.  Abdominal:     General: Abdomen is flat. Bowel sounds are normal.     Tenderness: There is no abdominal tenderness.  Neurological:     Mental Status: She is alert and oriented to person, place, and time.  Psychiatric:        Behavior: Behavior normal.     Assessment/Plan 1. Bacterial vaginosis Probiotic recommended, reviewed vaginal care to prevent BV (ie no douching). Let us know if not improved in 2 weeks time.  - clindamycin (CLEOCIN) 2 % vaginal cream; Place 1 Applicatorful vaginally at bedtime.  Dispense: 40 g; Refill: 1    Return if symptoms worsen or fail to improve.     Theodis Shove, MD

## 2019-05-01 ENCOUNTER — Ambulatory Visit (INDEPENDENT_AMBULATORY_CARE_PROVIDER_SITE_OTHER): Payer: BLUE CROSS/BLUE SHIELD | Admitting: Family Medicine

## 2019-05-01 ENCOUNTER — Encounter: Payer: Self-pay | Admitting: Family Medicine

## 2019-05-01 ENCOUNTER — Other Ambulatory Visit: Payer: Self-pay

## 2019-05-01 VITALS — BP 102/68 | HR 74 | Temp 97.6°F | Wt 163.0 lb

## 2019-05-01 DIAGNOSIS — Z23 Encounter for immunization: Secondary | ICD-10-CM | POA: Diagnosis not present

## 2019-05-01 DIAGNOSIS — Z209 Contact with and (suspected) exposure to unspecified communicable disease: Secondary | ICD-10-CM | POA: Diagnosis not present

## 2019-05-01 NOTE — Progress Notes (Signed)
   Subjective:    Patient ID: Tracy Haynes, female    DOB: 08/01/1991, 28 y.o.   MRN: [id number]  HPI Here asking for STD testing. She feels well and has no symptoms. She is in a relationship and has no exposures she is worried about, but she says she just wants to have peace of mind. Her last Pap was in December 2019. Her menses are regular. LMP was 2 weeks ago.   Review of Systems  Constitutional: Negative.   Respiratory: Negative.   Cardiovascular: Negative.   Gastrointestinal: Negative.   Genitourinary: Negative.        Objective:   Physical Exam Vitals signs reviewed.  Cardiovascular:     Rate and Rhythm: Normal rate and regular rhythm.     Pulses: Normal pulses.     Heart sounds: Normal heart sounds.  Pulmonary:     Effort: Pulmonary effort is normal.     Breath sounds: Normal breath sounds.           Assessment & Plan:  STD screening, the labs were ordered.  Alysia Penna, MD

## 2019-05-02 LAB — RPR: RPR Ser Ql: NONREACTIVE

## 2019-05-02 LAB — WET PREP FOR TRICH, YEAST, CLUE
MICRO NUMBER:: 934312
Specimen Quality: ADEQUATE

## 2019-05-02 LAB — HIV ANTIBODY (ROUTINE TESTING W REFLEX): HIV 1&2 Ab, 4th Generation: NONREACTIVE

## 2019-05-02 LAB — C. TRACHOMATIS/N. GONORRHOEAE RNA
C. trachomatis RNA, TMA: NOT DETECTED
N. gonorrhoeae RNA, TMA: NOT DETECTED

## 2019-05-04 ENCOUNTER — Other Ambulatory Visit: Payer: Self-pay

## 2019-05-04 ENCOUNTER — Telehealth (INDEPENDENT_AMBULATORY_CARE_PROVIDER_SITE_OTHER): Payer: BLUE CROSS/BLUE SHIELD | Admitting: Family Medicine

## 2019-05-04 DIAGNOSIS — Z3009 Encounter for other general counseling and advice on contraception: Secondary | ICD-10-CM | POA: Diagnosis not present

## 2019-05-04 NOTE — Patient Instructions (Signed)
Contraception Choices Contraception, also called birth control, refers to methods or devices that prevent pregnancy. Hormonal methods Contraceptive implant  A contraceptive implant is a thin, plastic tube that contains a hormone. It is inserted into the upper part of the arm. It can remain in place for up to 3 years. Progestin-only injections Progestin-only injections are injections of progestin, a synthetic form of the hormone progesterone. They are given every 3 months by a health care provider. Birth control pills  Birth control pills are pills that contain hormones that prevent pregnancy. They must be taken once a day, preferably at the same time each day. Birth control patch  The birth control patch contains hormones that prevent pregnancy. It is placed on the skin and must be changed once a week for three weeks and removed on the fourth week. A prescription is needed to use this method of contraception. Vaginal ring  A vaginal ring contains hormones that prevent pregnancy. It is placed in the vagina for three weeks and removed on the fourth week. After that, the process is repeated with a new ring. A prescription is needed to use this method of contraception. Emergency contraceptive Emergency contraceptives prevent pregnancy after unprotected sex. They come in pill form and can be taken up to 5 days after sex. They work best the sooner they are taken after having sex. Most emergency contraceptives are available without a prescription. This method should not be used as your only form of birth control. Barrier methods Female condom  A female condom is a thin sheath that is worn over the penis during sex. Condoms keep sperm from going inside a woman's body. They can be used with a spermicide to increase their effectiveness. They should be disposed after a single use. Female condom  A female condom is a soft, loose-fitting sheath that is put into the vagina before sex. The condom keeps sperm  from going inside a woman's body. They should be disposed after a single use. Diaphragm  A diaphragm is a soft, dome-shaped barrier. It is inserted into the vagina before sex, along with a spermicide. The diaphragm blocks sperm from entering the uterus, and the spermicide kills sperm. A diaphragm should be left in the vagina for 6-8 hours after sex and removed within 24 hours. A diaphragm is prescribed and fitted by a health care provider. A diaphragm should be replaced every 1-2 years, after giving birth, after gaining more than 15 lb (6.8 kg), and after pelvic surgery. Cervical cap  A cervical cap is a round, soft latex or plastic cup that fits over the cervix. It is inserted into the vagina before sex, along with spermicide. It blocks sperm from entering the uterus. The cap should be left in place for 6-8 hours after sex and removed within 48 hours. A cervical cap must be prescribed and fitted by a health care provider. It should be replaced every 2 years. Sponge  A sponge is a soft, circular piece of polyurethane foam with spermicide on it. The sponge helps block sperm from entering the uterus, and the spermicide kills sperm. To use it, you make it wet and then insert it into the vagina. It should be inserted before sex, left in for at least 6 hours after sex, and removed and thrown away within 30 hours. Spermicides Spermicides are chemicals that kill or block sperm from entering the cervix and uterus. They can come as a cream, jelly, suppository, foam, or tablet. A spermicide should be inserted into the  vagina with an applicator at least 10-15 minutes before sex to allow time for it to work. The process must be repeated every time you have sex. Spermicides do not require a prescription. Intrauterine contraception Intrauterine device (IUD) An IUD is a T-shaped device that is put in a woman's uterus. There are two types:  Hormone IUD.This type contains progestin, a synthetic form of the hormone  progesterone. This type can stay in place for 3-5 years.  Copper IUD.This type is wrapped in copper wire. It can stay in place for 10 years.  Permanent methods of contraception Female tubal ligation In this method, a woman's fallopian tubes are sealed, tied, or blocked during surgery to prevent eggs from traveling to the uterus. Hysteroscopic sterilization In this method, a small, flexible insert is placed into each fallopian tube. The inserts cause scar tissue to form in the fallopian tubes and block them, so sperm cannot reach an egg. The procedure takes about 3 months to be effective. Another form of birth control must be used during those 3 months. Female sterilization This is a procedure to tie off the tubes that carry sperm (vasectomy). After the procedure, the man can still ejaculate fluid (semen). Natural planning methods Natural family planning In this method, a couple does not have sex on days when the woman could become pregnant. Calendar method This means keeping track of the length of each menstrual cycle, identifying the days when pregnancy can happen, and not having sex on those days. Ovulation method In this method, a couple avoids sex during ovulation. Symptothermal method This method involves not having sex during ovulation. The woman typically checks for ovulation by watching changes in her temperature and in the consistency of cervical mucus. Post-ovulation method In this method, a couple waits to have sex until after ovulation. Summary  Contraception, also called birth control, means methods or devices that prevent pregnancy.  Hormonal methods of contraception include implants, injections, pills, patches, vaginal rings, and emergency contraceptives.  Barrier methods of contraception can include female condoms, female condoms, diaphragms, cervical caps, sponges, and spermicides.  There are two types of IUDs (intrauterine devices). An IUD can be put in a woman's uterus to  prevent pregnancy for 3-5 years.  Permanent sterilization can be done through a procedure for males, females, or both.  Natural family planning methods involve not having sex on days when the woman could become pregnant. This information is not intended to replace advice given to you by your health care provider. Make sure you discuss any questions you have with your health care provider. Document Released: 07/19/2005 Document Revised: 07/21/2017 Document Reviewed: 08/21/2016 Elsevier Patient Education  2020 [employer].  Contraceptive Barrier Methods A barrier method is a type of birth control (contraception) that is used to prevent pregnancy. Barrier methods include:  Female condom.  Female condom.  Diaphragm.  Cervical cap.  Sponge.  Spermicide. Your health care provider can help you decide what form of contraception is best for you. Always keep in mind that the risk of an STI (sexually transmitted infection) exists even when a contraceptive barrier method is used. Female condom  A female condom is a thin sheath that is worn over the penis during sex. Condoms prevent pregnancy by catching and stopping sperm from reaching the uterus. They also help to protect against STIs. Some condoms come with a sperm-killing substance (spermicide) on them. Female condoms are made of latex, rubber, or a type of plastic called polyurethane. Condoms that are made of latex  and polyurethane provide the best protection against many STIs, including HIV (human immunodeficiency virus). Female condoms can only be worn once. They should not be used with oil-based lubricants like petroleum jelly, lotions, or oils, because these lubricants make them less effective. They can be used with water-based lubricants available from your health care provider and over-the-counter. Water-based lubricants do not contain silicone, wax, or oil. Female condom  The female condom is a soft, loose-fitting sheath that is put into the  vagina before sex. It is held in place by two closed inner rings, one at the cervix and the other at the vaginal opening. The female condom prevents pregnancy by catching sperm and blocking its passage to the uterus. It also helps to protect against STIs. A female condom is intended for one-time use only. It can be inserted as long as 8 hours before sex. You should not use a female condom while your partner uses a female condom because the condoms can stick to each other and break. Diaphragm  A diaphragm is a soft, latex or silicone dome-shaped barrier that is placed in the vagina with spermicidal jelly before sex. A diaphragm covers the cervix, kills sperm, and blocks the passage of sperm into the cervix. It does not protect against STIs. This barrier method requires a prescription and must be fitted by a health care provider. A diaphragm can be inserted up to 2 hours before sex. If it is inserted more than 2 hours before sex, the spermicide must be applied again. A diaphragm should be left in the vagina for 6-8 hours after sex. Before sex can occur again during these 6 hours, spermicide must be reapplied. A diaphragm should not be used during a menstrual period. Cervical cap  A cervical cap is a round, soft, latex or plastic cup that is put in the vagina and fits over the cervix. It stays in place by the use of suction. A cervical cap should be used with a spermicide. It provides continuous protection as long as it is in place, regardless of how many times you have sex. It does not protect against STIs. Cervical caps may be inserted as long as 6 hours before sexual activity. They must be left in place for at least 6 hours after sex and can be left in place for as long as 48 hours. A cervical cap must be fitted by a health care provider. If you lose or gain a certain amount of weight your health care provider may need to refit the cap. You cannot use a cervical cap during your period. Sponge  A sponge is  a soft, circular piece of polyurethane foam that has spermicide in it. It is made wet with clean water and then placed into the vagina and over the cervix before sex. The foam is designed to trap and absorb sperm before it enters the cervix while the spermicide kills or immobilizes sperm. A sponge offers an immediate and continuous presence of spermicide throughout a 24-hour period no matter how many times you have sex. It does not protect against STIs. A sponge should be left in place for at least 6 hours after sex. It should not be left in for more than 24 hours, and it cannot be reused. It has a loop for removal. This barrier method can be purchased over-the-counter. You may use it if you are breastfeeding. Do not use if you have your period. Spermicide Spermicides are chemicals that kill or block sperm from entering the cervix  and uterus. They are inserted into the vagina with an applicator before sex. Spermicides do not protect against STIs. Spermicides come as creams, jellies, suppositories, foam, film, or tablets. Suppositories, film, and tablets should be inserted 10-30 minutes before sex so they can dissolve. To be effective, a new spermicide must be inserted every time you have sex. Summary  A barrier method is a type of birth control (contraception) that is used to prevent pregnancy.  Your health care provider can help you decide what form of contraception is best for you.  Always keep in mind that the risk of an STI (sexually transmitted infection) exists even when a contraceptive barrier method is used. This information is not intended to replace advice given to you by your health care provider. Make sure you discuss any questions you have with your health care provider. Document Released: 05/16/2007 Document Revised: 07/01/2017 Document Reviewed: 06/07/2016 Elsevier Patient Education  2020 [employer]  Oral Contraception Information Oral contraceptive pills (OCPs) are medicines  taken to prevent pregnancy. OCPs are taken by mouth, and they work by:  Preventing the ovaries from releasing eggs.  Thickening mucus in the lower part of the uterus (cervix), which prevents sperm from entering the uterus.  Thinning the lining of the uterus (endometrium), which prevents a fertilized egg from attaching to the endometrium. OCPs are highly effective when taken exactly as prescribed. However, OCPs do not prevent STIs (sexually transmitted infections). Safe sex practices, such as using condoms while on an OCP, can help prevent STIs. Before starting OCPs Before you start taking OCPs, you may have a physical exam, blood test, and Pap test. However, you are not required to have a pelvic exam in order to be prescribed OCPs. Your health care provider will make sure you are a good candidate for oral contraception. OCPs are not a good option for certain women, including women who smoke and are older than 35 years, and women with a medical history of high blood pressure, deep vein thrombosis, pulmonary embolism, stroke, cardiovascular disease, or peripheral vascular disease. Discuss with your health care provider the possible side effects of the OCP you may be prescribed. When you start an OCP, be aware that it can take 2-3 months for your body to adjust to changes in hormone levels. Follow instructions from your health care provider about how to start taking your first cycle of OCPs. Depending on when you start the pill, you may need to use a backup form of birth control, such as condoms, during the first week. Make sure you know what steps to take if you ever forget to take the pill. Types of oral contraception  The most common types of birth control pills contain the hormones estrogen and progestin (synthetic progesterone) or progestin only. The combination pill This type of pill contains estrogen and progestin hormones. Combination pills often come in packs of 21, 28, or 91 pills. For each  pack, the last 7 pills may not contain hormones, which means you may stop taking the pills for 7 days. Menstrual bleeding occurs during the week that you do not take the pills or that you take the pills with no hormones in them. The minipill This type of pill contains the progestin hormone only. It comes in packs of 28 pills. All 28 pills contain the hormone. You take the pill every day. It is very important to take the pill at the same time each day. Advantages of oral contraceptive pills  Provides reliable and continuous contraception  if taken as instructed.  May treat or decrease symptoms of: ? Menstrual period cramps. ? Irregular menstrual cycle or bleeding. ? Heavy menstrual flow. ? Abnormal uterine bleeding. ? Acne, depending on the type of pill. ? Polycystic ovarian syndrome. ? Endometriosis. ? Iron deficiency anemia. ? Premenstrual symptoms, including premenstrual dysphoric disorder.  May reduce the risk of endometrial and ovarian cancer.  Can be used as emergency contraception.  Prevents mislocated (ectopic) pregnancies and infections of the fallopian tubes. Things that can make oral contraceptive pills less effective OCPs can be less effective if:  You forget to take the pill at the same time every day. This is especially important when taking the minipill.  You have a stomach or intestinal disease that reduces your body's ability to absorb the pill.  You take OCPs with other medicines that make OCPs less effective, such as antibiotics, certain HIV medicines, and some seizure medicines.  You take expired OCPs.  You forget to restart the pill on day 7, if using the packs of 21 pills. Risks associated with oral contraceptive pills Oral contraceptive pills can sometimes cause side effects, such as:  Headache.  Depression.  Trouble sleeping.  Nausea and vomiting.  Breast tenderness.  Irregular bleeding or spotting during the first several months.  Bloating or  fluid retention.  Increase in blood pressure. Combination pills are also associated with a small increase in the risk of:  Blood clots.  Heart attack.  Stroke. Summary  Oral contraceptive pills are medicines taken by mouth to prevent pregnancy. They are highly effective when taken exactly as prescribed.  The most common types of birth control pills contain the hormones estrogen and progestin (synthetic progesterone) or progestin only.  Before you start taking the pill, you may have a physical exam, blood test, and Pap test. Your health care provider will make sure you are a good candidate for oral contraception.  The combination pill may come in a 21-day pack, a 28-day pack, or a 91-day pack. The minipill contains the progesterone hormone only and comes in packs of 28 pills.  Oral contraceptive pills can sometimes cause side effects, such as headache, nausea, breast tenderness, or irregular bleeding. This information is not intended to replace advice given to you by your health care provider. Make sure you discuss any questions you have with your health care provider. Document Released: 10/09/2002 Document Revised: 07/01/2017 Document Reviewed: 10/12/2016 Elsevier Patient Education  2020 [employer].  Intrauterine Device Information An intrauterine device (IUD) is a medical device that is inserted in the uterus to prevent pregnancy. It is a small, T-shaped device that has one or two nylon strings hanging down from it. The strings hang out of the lower part of the uterus (cervix) to allow for future IUD removal. There are two types of IUDs available:  Hormone IUD. This type of IUD is made of plastic and contains the hormone progestin (synthetic progesterone). A hormone IUD may last 3-5 years.  Copper IUD. This type of IUD has copper wire wrapped around it. A copper IUD may last up to 10 years. How is an IUD inserted? An IUD is inserted through the vagina and placed into the uterus  with a minor medical procedure. The exact procedure for IUD insertion may vary among health care providers and hospitals. How does an IUD work? Synthetic progesterone in a hormonal IUD prevents pregnancy by:  Thickening cervical mucus to prevent sperm from entering the uterus.  Thinning the uterine lining to prevent a  fertilized egg from being implanted there. Copper in a copper IUD prevents pregnancy by making the uterus and fallopian tubes produce a fluid that kills sperm. What are the advantages of an IUD? Advantages of either type of IUD  It is highly effective in preventing pregnancy.  It is reversible. You can become pregnant shortly after the IUD is removed.  It is low-maintenance and can stay in place for a long time.  There are no estrogen-related side effects.  It can be used when breastfeeding.  It is not associated with weight gain.  It can be inserted right after childbirth, an abortion, or a miscarriage. Advantages of a hormone IUD  If it is inserted within 7 days of your period starting, it works right after it is inserted. If the hormone IUD is inserted at any other time in your cycle, you will need to use a backup method of birth control for 7 days after insertion.  It can make menstrual periods lighter.  It can reduce menstrual cramping.  It can be used for 3-5 years. Advantages of a copper IUD  It works right after it is inserted.  It can be used as a form of emergency birth control if it is inserted within 5 days after having unprotected sex.  It does not interfere with your body's natural hormones.  It can be used for 10 years. What are the disadvantages of an IUD?  An IUD may cause irregular menstrual bleeding for a period of time after insertion.  You may have pain during insertion and have cramping and vaginal bleeding after insertion.  An IUD may cut the uterus (uterine perforation) when it is inserted. This is rare.  An IUD may cause pelvic  inflammatory disease (PID), which is an infection in the uterus and fallopian tubes. This is rare, and it usually happens during the first 20 days after the IUD is inserted.  A copper IUD can make your menstrual flow heavier and more painful. How is an IUD removed?  You will lie on your back with your knees bent and your feet in footrests (stirrups).  A device will be inserted into your vagina to spread apart the vaginal walls (speculum). This will allow your health care provider to see the strings attached to the IUD.  Your health care provider will use a small instrument (forceps) to grasp the IUD strings and pull firmly until the IUD is removed. You may have some discomfort when the IUD is removed. Your health care provider may recommend taking over-the-counter pain relievers, such as ibuprofen, before the procedure. You may also have minor spotting for a few days after the procedure. The exact procedure for IUD removal may vary among health care providers and hospitals. Is the IUD right for me? Your health care provider will make sure you are a good candidate for an IUD and will discuss the advantages, disadvantages, and possible side effects with you. Summary  An intrauterine device (IUD) is a medical device that is inserted in the uterus to prevent pregnancy. It is a small, T-shaped device that has one or two nylon strings hanging down from it.  A hormone IUD contains the hormone progestin (synthetic progesterone). A copper IUD has copper wire wrapped around it.  Synthetic progesterone in a hormone IUD prevents pregnancy by thickening cervical mucus and thinning the walls of the uterus. Copper in a copper IUD prevents pregnancy by making the uterus and fallopian tubes produce a fluid that kills sperm.  A  hormone IUD can be left in place for 3-5 years. A copper IUD can be left in place for up to 10 years.  An IUD is inserted and removed by a health care provider. You may feel some pain  during insertion and removal. Your health care provider may recommend taking over-the-counter pain medicine, such as ibuprofen, before an IUD procedure. This information is not intended to replace advice given to you by your health care provider. Make sure you discuss any questions you have with your health care provider. Document Released: 06/22/2004 Document Revised: 07/01/2017 Document Reviewed: 08/17/2016 Elsevier Patient Education  2020 [employer]  Diaphragm Information and Use  A diaphragm is a soft, latex or silicone dome-shaped barrier that is placed in the vagina with sperm-killing (spermicidal) jelly before sex. It covers the cervix, kills sperm, and blocks the passage of sperm into the cervix. This method does not protect against STIs (sexually transmitted infections). The diaphragm can be inserted up to 2 hours before sex. If it is inserted more than 2 hours before sex, the spermicide must be applied again. A diaphragm must be fitted by a health care provider during a pelvic exam. The health care provider will measure your vagina before prescribing a diaphragm. During the exam, you will also learn about the use and care of a diaphragm as well as possible problems. It is important to have the diaphragm rechecked and possibly refitted if you:  Become pregnant.  Have significant weight changes (you gain or lose 20% of your body weight), such as gaining or losing 32 lb (14 kg) after weighing 160 lb (72 kg). The diaphragm should be replaced every 2 years, or sooner if damaged. What are the advantages of using a diaphragm?  You can use it while breastfeeding.  It is not felt by your sexual partner.  It does not interfere with your female hormones.  It works immediately and is not permanent.  It has few side effects or risks associated with its use.  When used properly, it is safe and effective. What are the disadvantages of using a diaphragm?  It is sometimes difficult to  insert.  It may shift out of place during sex.  It requires a prescription, and you must have it fitted and refitted by your health care provider.  It may increase the risk of urinary tract infections.  There is an increased risk of toxic shock syndrome if it is left in place for more than 24 hours.  It should not be used during your menstrual period.  It may cause you pain or discomfort during sex.  It does not protect against STIs, including HIV (human immunodeficiency virus). Using a condom is recommended to reduce these risks. How to insert a diaphragm  1. Empty your bladder by urinating before you insert the diaphragm. 2. Wash your hands with soap and water before inserting the diaphragm. If soap and water are not available, use hand sanitizer. 3. Check the diaphragm for holes by holding it up to the light, stretching the latex, or filling it with water. 4. Place the spermicide cream or jelly inside the dome and around the rim of the diaphragm. 5. Squeeze the rim of the diaphragm and insert the diaphragm into the vagina. ? The opening of the dome should face the cervix while you are inserting the diaphragm. ? The front part (or top) of the rim should be behind the pubic bone and pushed over the top of the cervix. 6. Be sure  the cervix is completely covered. Do this by reaching into your vagina and feeling the cervix behind the latex dome of the diaphragm. If you are uncomfortable, then it is not inserted properly. Try inserting it again. 7. Leave the diaphragm in for 6-8 hours after having sex. Before sex can occur again during these 6 hours, spermicide must be reapplied. 8. Do not douche with the diaphragm in place. 9. The diaphragm should not be left in place for longer than 24 hours. 10. To remove the diaphragm, insert a finger into your vagina and slip it under the rim. Then slide the diaphragm out gently. Follow these instructions at home:  Wash the diaphragm with mild soap  and warm water. Rinse it thoroughly and dry it completely after every use.  Only use water-based lubricants with the diaphragm. Oil-based lubricants can damage the diaphragm. Water-based lubricants do not contain silicone, wax, or oil.  Do not use talc on the diaphragm.  Do not use the diaphragm if: ? You had a baby in the last 2 months. ? You have a vaginal infection. ? You are having a menstrual period. ? You had a recent surgery on your cervix or vagina. ? You have vaginal bleeding of unknown cause. ? Your sexual partner is allergic to latex or spermicides. Contact a health care provider if:  You have pain during sexual intercourse when using the diaphragm.  The diaphragm slips out of place during sex.  You have blood in your urine.  You have burning or pain when you urinate.  You find a hole in the diaphragm.  You develop abnormal vaginal discharge.  You have itching or irritation in your vagina.  You cannot remove the diaphragm.  You think you may be pregnant.  You need to be refitted for a diaphragm. Summary  A diaphragm is a soft, latex or silicone dome-shaped barrier that is placed in the vagina with sperm-killing (spermicidal) jelly before sex.  This method does not protect against STIs (sexually transmitted infections).  A diaphragm requires a prescription, and you must have it fitted and refitted by your health care provider.  The diaphragm can be inserted up to 2 hours before sex.  Leave the diaphragm in for 6-8 hours after having sex. Before sex can occur again during these 6 hours, spermicide must be reapplied. This information is not intended to replace advice given to you by your health care provider. Make sure you discuss any questions you have with your health care provider. Document Released: 10/09/2002 Document Revised: 11/08/2018 Document Reviewed: 06/07/2016 Elsevier Patient Education  2020 [employer].

## 2019-05-04 NOTE — Progress Notes (Signed)
Virtual Visit via Video Note  I connected with Tracy Haynes on 05/04/19 at  1:00 PM EDT by a video enabled telemedicine application 2/2 VOJJK-09 pandemic and verified that I am speaking with the correct person using two identifiers.  Location patient: home Location provider:work or home office Persons participating in the virtual visit: patient, provider  I discussed the limitations of evaluation and management by telemedicine and the availability of in person appointments. The patient expressed understanding and agreed to proceed.   HPI: Pt seen 9/29 for STI testing.  She was advised to f/u with pcp.  Pt interested in non-hormonal birth control options.  Pt used nexplanon in the past.  Does not want to be on something that causes weight gain.  Pt denies h/o migraines, blood clots/clotting d/o, tobacco use.    ROS: See pertinent positives and negatives per HPI.  Past Medical History:  Diagnosis Date  . Chlamydia infection   . Gonorrhea   . H/O varicella   . Hx: UTI (urinary tract infection)     Past Surgical History:  Procedure Laterality Date  . WISDOM TOOTH EXTRACTION      Family History  Problem Relation Age of Onset  . Hypertension Mother   . Arthritis Mother   . Thyroid disease Mother   . Asthma Brother   . Thalassemia Brother   . Alcohol abuse Father   . Diabetes Maternal Aunt   . Anesthesia problems Neg Hx     No current outpatient medications on file.  EXAM:  VITALS per patient if applicable:  GENERAL: alert, oriented, appears well and in no acute distress  HEENT: atraumatic, conjunctiva clear, no obvious abnormalities on inspection of external nose and ears  NECK: normal movements of the head and neck  LUNGS: on inspection no signs of respiratory distress, breathing rate appears normal, no obvious gross SOB, gasping or wheezing  CV: no obvious cyanosis  MS: moves all visible extremities without noticeable abnormality  PSYCH/NEURO: pleasant and  cooperative, no obvious depression or anxiety, speech and thought processing grossly intact  ASSESSMENT AND PLAN:  Discussed the following assessment and plan:  Encounter for other general counseling or advice on contraception -discussed various contraception options including r/b/a. -handouts sent to mychart and on on pt instructions.  Pt to review and decided if wishes to start OCPs vs patch.  F/u prn  I discussed the assessment and treatment plan with the patient. The patient was provided an opportunity to ask questions and all were answered. The patient agreed with the plan and demonstrated an understanding of the instructions.   The patient was advised to call back or seek an in-person evaluation if the symptoms worsen or if the condition fails to improve as anticipated.  Billie Ruddy, MD

## 2019-05-15 NOTE — Progress Notes (Signed)
Please advise 

## 2019-05-28 ENCOUNTER — Encounter: Payer: Self-pay | Admitting: Family Medicine

## 2019-06-05 ENCOUNTER — Ambulatory Visit (INDEPENDENT_AMBULATORY_CARE_PROVIDER_SITE_OTHER): Payer: BLUE CROSS/BLUE SHIELD | Admitting: Internal Medicine

## 2019-06-05 ENCOUNTER — Other Ambulatory Visit: Payer: Self-pay

## 2019-06-05 ENCOUNTER — Encounter: Payer: Self-pay | Admitting: Internal Medicine

## 2019-06-05 VITALS — BP 118/62 | HR 75 | Temp 97.9°F | Wt 166.6 lb

## 2019-06-05 DIAGNOSIS — N898 Other specified noninflammatory disorders of vagina: Secondary | ICD-10-CM | POA: Diagnosis not present

## 2019-06-05 MED ORDER — FLUCONAZOLE 150 MG PO TABS: 150.0000 mg | ORAL_TABLET | Freq: Once | ORAL | 0 refills | Status: AC

## 2019-06-05 NOTE — Progress Notes (Signed)
Chief Complaint  Patient presents with  . Vaginitis    Pt believes she has a yeast infection that started 5 days ago with itching pt states it is a little red pt reports no vaginal discharge or odor. Pt tried vagistil     HPI: Tracy Haynes 28 y.o. come in for above . PCP NA    5 days of vag itching using vagisol alone  No sig dc  Not sure if yeast but thinks could be and wants evaluation. Has been tested for sti a few month ago one partner intermittent  Condom use  No fever uti sx  LMp  about octi 26? Nl for her  ROS: See pertinent positives and negatives per HPI. No fver pain rash abd pain  Past Medical History:  Diagnosis Date  . Chlamydia infection   . Gonorrhea   . H/O varicella   . Hx: UTI (urinary tract infection)     Family History  Problem Relation Age of Onset  . Hypertension Mother   . Arthritis Mother   . Thyroid disease Mother   . Asthma Brother   . Thalassemia Brother   . Alcohol abuse Father   . Diabetes Maternal Aunt   . Anesthesia problems Neg Hx     Social History   Socioeconomic History  . Marital status: Single    Spouse name: Not on file  . Number of children: 1  . Years of education: Not on file  . Highest education level: Master's degree (e.g., MA, MS, MEng, MEd, MSW, MBA)  Occupational History  . Not on file  Social Needs  . Financial resource strain: Not on file  . Food insecurity    Worry: Not on file    Inability: Not on file  . Transportation needs    Medical: Not on file    Non-medical: Not on file  Tobacco Use  . Smoking status: Never Smoker  . Smokeless tobacco: Never Used  Substance and Sexual Activity  . Alcohol use: No    Frequency: Never    Comment: quit 2018  . Drug use: No  . Sexual activity: Yes    Birth control/protection: Implant    Comment: NEXPLANON  Lifestyle  . Physical activity    Days per week: Not on file    Minutes per session: Not on file  . Stress: Not on file  Relationships  . Social  Musician on phone: Not on file    Gets together: Not on file    Attends religious service: Not on file    Active member of club or organization: Not on file    Attends meetings of clubs or organizations: Not on file    Relationship status: Not on file  Other Topics Concern  . Not on file  Social History Narrative   Lives at home with her daughter   Right handed   Quit caffeine February 2019.    No outpatient medications prior to visit.   No facility-administered medications prior to visit.      EXAM:  BP 118/62 (BP Location: Right Arm, Patient Position: Sitting, Cuff Size: Normal)   Pulse 75   Temp 97.9 F (36.6 C) (Temporal)   Wt 166 lb 9.6 oz (75.6 kg)   SpO2 99%   BMI 31.48 kg/m   Body mass index is 31.48 kg/m.  GENERAL: vitals reviewed and listed above, alert, oriented, appears well hydrated and in no acute distress HEENT: atraumatic, conjunctiva  clear, no obvious abnormalities on inspection of external nose and ears   NECK: no obvious masses on inspection palpation   CV: HRRR, no clubbing cyanosis or  peripheral edema nl cap refill  MS: moves all extremities without noticeable focal  Abnormality GU ext gu no lesion dc but red1+ labial  irritaiton no vesicle or dc  cx os clear and mininmal dc noted  White  No lesion  aptima ordered  PSYCH: pleasant and cooperative, no obvious depression or anxiety Lab Results  Component Value Date   WBC 7.9 06/16/2018   HGB 13.5 06/16/2018   HCT 40.8 06/16/2018   PLT 367.0 06/16/2018   GLUCOSE 82 06/16/2018   CHOL 182 06/16/2018   TRIG 46.0 06/16/2018   HDL 53.10 06/16/2018   LDLCALC 120 (H) 06/16/2018   ALT 29 11/04/2017   AST 27 11/04/2017   NA 137 06/16/2018   K 4.8 06/16/2018   CL 103 06/16/2018   CREATININE 0.63 06/16/2018   BUN 10 06/16/2018   CO2 29 06/16/2018   TSH 2.61 06/16/2018   HGBA1C 5.4 06/16/2018   BP Readings from Last 3 Encounters:  06/05/19 118/62  05/01/19 102/68  08/07/18 (!)  100/54    ASSESSMENT AND PLAN:  Discussed the following assessment and plan:  Vaginal itching - Plan: Cervicovaginal ancillary only Disc contraception use  And risk   Suspect yeast but has risk for other   Disc options otc monistat but   She interested  In oral rx if insurance will pay but could use otc monistat also  Report info to her when back  -Patient advised to return or notify health care team  if  new concerns arise.  Patient Instructions  Exam is normal except the irritation redness . We can have you treat for yeast  And wait   for the results .  rx for diglucan sent to your pharmacy   Monistat can work just as well.  May take a  Day or 2 .  To get results back .    Vaginitis Vaginitis is a condition in which the vaginal tissue swells and becomes red (inflamed). This condition is most often caused by a change in the normal balance of bacteria and yeast that live in the vagina. This change causes an overgrowth of certain bacteria or yeast, which causes the inflammation. There are different types of vaginitis, but the most common types are:  Bacterial vaginosis.  Yeast infection (candidiasis).  Trichomoniasis vaginitis. This is a sexually transmitted disease (STD).  Viral vaginitis.  Atrophic vaginitis.  Allergic vaginitis. What are the causes? The cause of this condition depends on the type of vaginitis. It can be caused by:  Bacteria (bacterial vaginosis).  Yeast, which is a fungus (yeast infection).  A parasite (trichomoniasis vaginitis).  A virus (viral vaginitis).  Low hormone levels (atrophic vaginitis). Low hormone levels can occur during pregnancy, breastfeeding, or after menopause.  Irritants, such as bubble baths, scented tampons, and feminine sprays (allergic vaginitis). Other factors can change the normal balance of the yeast and bacteria that live in the vagina. These include:  Antibiotic medicines.  Poor hygiene.  Diaphragms, vaginal sponges,  spermicides, birth control pills, and intrauterine devices (IUD).  Sex.  Infection.  Uncontrolled diabetes.  A weakened defense (immune) system. What increases the risk? This condition is more likely to develop in women who:  Smoke.  Use vaginal douches, scented tampons, or scented sanitary pads.  Wear tight-fitting pants.  Wear thong underwear.  Use  oral birth control pills or an IUD.  Have sex without a condom.  Have multiple sex partners.  Have an STD.  Frequently use the spermicide nonoxynol-9.  Eat lots of foods high in sugar.  Have uncontrolled diabetes.  Have low estrogen levels.  Have a weakened immune system from an immune disorder or medical treatment.  Are pregnant or breastfeeding. What are the signs or symptoms? Symptoms vary depending on the cause of the vaginitis. Common symptoms include:  Abnormal vaginal discharge. ? The discharge is white, gray, or yellow with bacterial vaginosis. ? The discharge is thick, white, and cheesy with a yeast infection. ? The discharge is frothy and yellow or greenish with trichomoniasis.  A bad vaginal smell. The smell is fishy with bacterial vaginosis.  Vaginal itching, pain, or swelling.  Sex that is painful.  Pain or burning when urinating. Sometimes there are no symptoms. How is this diagnosed? This condition is diagnosed based on your symptoms and medical history. A physical exam, including a pelvic exam, will also be done. You may also have other tests, including:  Tests to determine the pH level (acidity or alkalinity) of your vagina.  A whiff test, to assess the odor that results when a sample of your vaginal discharge is mixed with a potassium hydroxide solution.  Tests of vaginal fluid. A sample will be examined under a microscope. How is this treated? Treatment varies depending on the type of vaginitis you have. Your treatment may include:  Antibiotic creams or pills to treat bacterial  vaginosis and trichomoniasis.  Antifungal medicines, such as vaginal creams or suppositories, to treat a yeast infection.  Medicine to ease discomfort if you have viral vaginitis. Your sexual partner should also be treated.  Estrogen delivered in a cream, pill, suppository, or vaginal ring to treat atrophic vaginitis. If vaginal dryness occurs, lubricants and moisturizing creams may help. You may need to avoid scented soaps, sprays, or douches.  Stopping use of a product that is causing allergic vaginitis. Then using a vaginal cream to treat the symptoms. Follow these instructions at home: Lifestyle  Keep your genital area clean and dry. Avoid soap, and only rinse the area with water.  Do not douche or use tampons until your health care provider says it is okay to do so. Use sanitary pads, if needed.  Do not have sex until your health care provider approves. When you can return to sex, practice safe sex and use condoms.  Wipe from front to back. This avoids the spread of bacteria from the rectum to the vagina. General instructions  Take over-the-counter and prescription medicines only as told by your health care provider.  If you were prescribed an antibiotic medicine, take or use it as told by your health care provider. Do not stop taking or using the antibiotic even if you start to feel better.  Keep all follow-up visits as told by your health care provider. This is important. How is this prevented?  Use mild, non-scented products. Do not use things that can irritate the vagina, such as fabric softeners. Avoid the following products if they are scented: ? Feminine sprays. ? Detergents. ? Tampons. ? Feminine hygiene products. ? Soaps or bubble baths.  Let air reach your genital area. ? Wear cotton underwear to reduce moisture buildup. ? Avoid wearing underwear while you sleep. ? Avoid wearing tight pants and underwear or nylons without a cotton panel. ? Avoid wearing thong  underwear.  Take off any wet clothing, such as  bathing suits, as soon as possible.  Practice safe sex and use condoms. Contact a health care provider if:  You have abdominal pain.  You have a fever.  You have symptoms that last for more than 2-3 days. Get help right away if:  You have a fever and your symptoms suddenly get worse. Summary  Vaginitis is a condition in which the vaginal tissue becomes inflamed.This condition is most often caused by a change in the normal balance of bacteria and yeast that live in the vagina.  Treatment varies depending on the type of vaginitis you have.  Do not douche, use tampons , or have sex until your health care provider approves. When you can return to sex, practice safe sex and use condoms. This information is not intended to replace advice given to you by your health care provider. Make sure you discuss any questions you have with your health care provider. Document Released: 05/16/2007 Document Revised: 07/01/2017 Document Reviewed: 08/24/2016 Elsevier Patient Education  2020 [employer] Toshiye Kever M.D.

## 2019-06-05 NOTE — Patient Instructions (Addendum)
Exam is normal except the irritation redness . We can have you treat for yeast  And wait   for the results .  rx for diglucan sent to your pharmacy   Monistat can work just as well.  May take a  Day or 2 .  To get results back .    Vaginitis Vaginitis is a condition in which the vaginal tissue swells and becomes red (inflamed). This condition is most often caused by a change in the normal balance of bacteria and yeast that live in the vagina. This change causes an overgrowth of certain bacteria or yeast, which causes the inflammation. There are different types of vaginitis, but the most common types are:  Bacterial vaginosis.  Yeast infection (candidiasis).  Trichomoniasis vaginitis. This is a sexually transmitted disease (STD).  Viral vaginitis.  Atrophic vaginitis.  Allergic vaginitis. What are the causes? The cause of this condition depends on the type of vaginitis. It can be caused by:  Bacteria (bacterial vaginosis).  Yeast, which is a fungus (yeast infection).  A parasite (trichomoniasis vaginitis).  A virus (viral vaginitis).  Low hormone levels (atrophic vaginitis). Low hormone levels can occur during pregnancy, breastfeeding, or after menopause.  Irritants, such as bubble baths, scented tampons, and feminine sprays (allergic vaginitis). Other factors can change the normal balance of the yeast and bacteria that live in the vagina. These include:  Antibiotic medicines.  Poor hygiene.  Diaphragms, vaginal sponges, spermicides, birth control pills, and intrauterine devices (IUD).  Sex.  Infection.  Uncontrolled diabetes.  A weakened defense (immune) system. What increases the risk? This condition is more likely to develop in women who:  Smoke.  Use vaginal douches, scented tampons, or scented sanitary pads.  Wear tight-fitting pants.  Wear thong underwear.  Use oral birth control pills or an IUD.  Have sex without a condom.  Have multiple sex  partners.  Have an STD.  Frequently use the spermicide nonoxynol-9.  Eat lots of foods high in sugar.  Have uncontrolled diabetes.  Have low estrogen levels.  Have a weakened immune system from an immune disorder or medical treatment.  Are pregnant or breastfeeding. What are the signs or symptoms? Symptoms vary depending on the cause of the vaginitis. Common symptoms include:  Abnormal vaginal discharge. ? The discharge is white, gray, or yellow with bacterial vaginosis. ? The discharge is thick, white, and cheesy with a yeast infection. ? The discharge is frothy and yellow or greenish with trichomoniasis.  A bad vaginal smell. The smell is fishy with bacterial vaginosis.  Vaginal itching, pain, or swelling.  Sex that is painful.  Pain or burning when urinating. Sometimes there are no symptoms. How is this diagnosed? This condition is diagnosed based on your symptoms and medical history. A physical exam, including a pelvic exam, will also be done. You may also have other tests, including:  Tests to determine the pH level (acidity or alkalinity) of your vagina.  A whiff test, to assess the odor that results when a sample of your vaginal discharge is mixed with a potassium hydroxide solution.  Tests of vaginal fluid. A sample will be examined under a microscope. How is this treated? Treatment varies depending on the type of vaginitis you have. Your treatment may include:  Antibiotic creams or pills to treat bacterial vaginosis and trichomoniasis.  Antifungal medicines, such as vaginal creams or suppositories, to treat a yeast infection.  Medicine to ease discomfort if you have viral vaginitis. Your sexual partner should also  be treated.  Estrogen delivered in a cream, pill, suppository, or vaginal ring to treat atrophic vaginitis. If vaginal dryness occurs, lubricants and moisturizing creams may help. You may need to avoid scented soaps, sprays, or douches.  Stopping  use of a product that is causing allergic vaginitis. Then using a vaginal cream to treat the symptoms. Follow these instructions at home: Lifestyle  Keep your genital area clean and dry. Avoid soap, and only rinse the area with water.  Do not douche or use tampons until your health care provider says it is okay to do so. Use sanitary pads, if needed.  Do not have sex until your health care provider approves. When you can return to sex, practice safe sex and use condoms.  Wipe from front to back. This avoids the spread of bacteria from the rectum to the vagina. General instructions  Take over-the-counter and prescription medicines only as told by your health care provider.  If you were prescribed an antibiotic medicine, take or use it as told by your health care provider. Do not stop taking or using the antibiotic even if you start to feel better.  Keep all follow-up visits as told by your health care provider. This is important. How is this prevented?  Use mild, non-scented products. Do not use things that can irritate the vagina, such as fabric softeners. Avoid the following products if they are scented: ? Feminine sprays. ? Detergents. ? Tampons. ? Feminine hygiene products. ? Soaps or bubble baths.  Let air reach your genital area. ? Wear cotton underwear to reduce moisture buildup. ? Avoid wearing underwear while you sleep. ? Avoid wearing tight pants and underwear or nylons without a cotton panel. ? Avoid wearing thong underwear.  Take off any wet clothing, such as bathing suits, as soon as possible.  Practice safe sex and use condoms. Contact a health care provider if:  You have abdominal pain.  You have a fever.  You have symptoms that last for more than 2-3 days. Get help right away if:  You have a fever and your symptoms suddenly get worse. Summary  Vaginitis is a condition in which the vaginal tissue becomes inflamed.This condition is most often caused by a  change in the normal balance of bacteria and yeast that live in the vagina.  Treatment varies depending on the type of vaginitis you have.  Do not douche, use tampons , or have sex until your health care provider approves. When you can return to sex, practice safe sex and use condoms. This information is not intended to replace advice given to you by your health care provider. Make sure you discuss any questions you have with your health care provider. Document Released: 05/16/2007 Document Revised: 07/01/2017 Document Reviewed: 08/24/2016 Elsevier Patient Education  2020 [employer].

## 2019-06-07 LAB — CERVICOVAGINAL ANCILLARY ONLY
Bacterial Vaginitis (gardnerella): NEGATIVE
Candida Glabrata: NEGATIVE
Candida Vaginitis: POSITIVE — AB
Chlamydia: NEGATIVE
Comment: NEGATIVE
Comment: NEGATIVE
Comment: NEGATIVE
Comment: NEGATIVE
Comment: NEGATIVE
Comment: NORMAL
Neisseria Gonorrhea: NEGATIVE
Trichomonas: NEGATIVE

## 2019-06-09 NOTE — Telephone Encounter (Signed)
Message was sent to to pt through MyChart portal

## 2019-06-25 ENCOUNTER — Telehealth: Payer: Self-pay

## 2019-06-25 NOTE — Telephone Encounter (Signed)
Dr Volanda Napoleon advise sent to pt through [place]

## 2019-08-06 ENCOUNTER — Telehealth: Payer: Self-pay | Admitting: Family Medicine

## 2019-08-10 ENCOUNTER — Telehealth (INDEPENDENT_AMBULATORY_CARE_PROVIDER_SITE_OTHER): Payer: BLUE CROSS/BLUE SHIELD | Admitting: Family Medicine

## 2019-08-10 DIAGNOSIS — K5900 Constipation, unspecified: Secondary | ICD-10-CM

## 2019-08-10 DIAGNOSIS — R8290 Unspecified abnormal findings in urine: Secondary | ICD-10-CM

## 2019-08-10 DIAGNOSIS — N941 Unspecified dyspareunia: Secondary | ICD-10-CM | POA: Diagnosis not present

## 2019-08-10 NOTE — Progress Notes (Signed)
Virtual Visit via Video Note  I connected with Tracy Haynes on 08/10/19 at  9:30 AM EST by a video enabled telemedicine application 2/2 COVID-19 pandemic and verified that I am speaking with the correct person using two identifiers.  Location patient: home Location provider:work or home office Persons participating in the virtual visit: patient, provider  I discussed the limitations of evaluation and management by telemedicine and the availability of in person appointments. The patient expressed understanding and agreed to proceed.   HPI: Pt with cloudy urine and odor x several days.  Pt notes pain during recent intercourse.   Having pain in b/l lower abdomen/groin and with BMs.  Pain was intense, now 1/10.  Pt tried tylenol, soaking in the tub.  Last BM a few days ago, firm pebble like.  Drinking 1 bottle of water per day.  May have a serving of vegetables/day.  LMP 2 wks ago.  Denies back pain, n/v, vaginal d/c, dysuria.   ROS: See pertinent positives and negatives per HPI.  Past Medical History:  Diagnosis Date  . Chlamydia infection   . Gonorrhea   . H/O varicella   . Hx: UTI (urinary tract infection)     Past Surgical History:  Procedure Laterality Date  . WISDOM TOOTH EXTRACTION      Family History  Problem Relation Age of Onset  . Hypertension Mother   . Arthritis Mother   . Thyroid disease Mother   . Asthma Brother   . Thalassemia Brother   . Alcohol abuse Father   . Diabetes Maternal Aunt   . Anesthesia problems Neg Hx    No current outpatient medications on file.  EXAM:  VITALS per patient if applicable: RR between 12-20 bpm  GENERAL: alert, oriented, appears well and in no acute distress  HEENT: atraumatic, conjunctiva clear, no obvious abnormalities on inspection of external nose and ears  NECK: normal movements of the head and neck  LUNGS: on inspection no signs of respiratory distress, breathing rate appears normal, no obvious gross SOB, gasping  or wheezing  CV: no obvious cyanosis  MS: moves all visible extremities without noticeable abnormality  PSYCH/NEURO: pleasant and cooperative, no obvious depression or anxiety, speech and thought processing grossly intact  ASSESSMENT AND PLAN:  Discussed the following assessment and plan:  Cloudy urine  -concern for UTI, dehydration, vaginal infection, constipation -pt encouraged to increase fluid intake -will obtain UA.  UCx if needed based on UA result - Plan: POC Urinalysis Dipstick  Female dyspareunia  -discussed various causes including vaginal infection, constipation, ovarian cyst, fibroid.   Ovarian torsion less likely given pain b/l and resolution of pain. -Consider transvaginal u/s for continued or worsened symptoms. - Plan: Cervicovaginal ancillary only  Constipation, unspecified constipation type -possibly causing abd pain -discussed increasing po intake of fiber and fluids -consider Miralax prn   F/u next wk.  Will obtain labs on Monday.  Given precautions  I discussed the assessment and treatment plan with the patient. The patient was provided an opportunity to ask questions and all were answered. The patient agreed with the plan and demonstrated an understanding of the instructions.   The patient was advised to call back or seek an in-person evaluation if the symptoms worsen or if the condition fails to improve as anticipated.   Deeann Saint, MD

## 2019-08-13 ENCOUNTER — Other Ambulatory Visit (INDEPENDENT_AMBULATORY_CARE_PROVIDER_SITE_OTHER): Payer: BLUE CROSS/BLUE SHIELD

## 2019-08-13 ENCOUNTER — Inpatient Hospital Stay (HOSPITAL_COMMUNITY): Admit: 2019-08-13 | Payer: BLUE CROSS/BLUE SHIELD

## 2019-08-13 ENCOUNTER — Other Ambulatory Visit: Payer: Self-pay

## 2019-08-13 DIAGNOSIS — R8290 Unspecified abnormal findings in urine: Secondary | ICD-10-CM

## 2019-08-13 DIAGNOSIS — N941 Unspecified dyspareunia: Secondary | ICD-10-CM

## 2019-08-13 LAB — POCT URINALYSIS DIPSTICK
Glucose, UA: NEGATIVE
Leukocytes, UA: NEGATIVE
Protein, UA: POSITIVE — AB
Spec Grav, UA: >=1.03 — AB (ref 1.010–1.025)
Urobilinogen, UA: 0.2 E.U./dL
pH, UA: 6 (ref 5.0–8.0)

## 2019-08-14 LAB — CERVICOVAGINAL ANCILLARY ONLY
Bacterial Vaginitis (gardnerella): NEGATIVE
Candida Glabrata: NEGATIVE
Candida Vaginitis: POSITIVE — AB
Chlamydia: NEGATIVE
Comment: NEGATIVE
Comment: NEGATIVE
Comment: NEGATIVE
Comment: NEGATIVE
Comment: NEGATIVE
Comment: NORMAL
Neisseria Gonorrhea: NEGATIVE
Trichomonas: NEGATIVE

## 2019-08-15 ENCOUNTER — Telehealth: Payer: Self-pay | Admitting: *Deleted

## 2019-08-15 ENCOUNTER — Other Ambulatory Visit: Payer: Self-pay | Admitting: Family Medicine

## 2019-08-15 DIAGNOSIS — B373 Candidiasis of vulva and vagina: Secondary | ICD-10-CM

## 2019-08-15 DIAGNOSIS — B3731 Acute candidiasis of vulva and vagina: Secondary | ICD-10-CM

## 2019-08-15 MED ORDER — FLUCONAZOLE 150 MG PO TABS: 150.0000 mg | ORAL_TABLET | Freq: Once | ORAL | 0 refills | Status: AC

## 2019-08-15 NOTE — Telephone Encounter (Signed)
Spoke with pt verbalized understanding of her lab results and recommendations 

## 2019-08-15 NOTE — Telephone Encounter (Signed)
Copied from CRM [id number]. Topic: General - Other >> Aug 15, 2019  3:21 PM Jaquita Rector A wrote: Reason for CRM: Pateint called to speak to [facility] asking for a call back at Ph# [phone]

## 2019-08-31 ENCOUNTER — Ambulatory Visit: Payer: BLUE CROSS/BLUE SHIELD | Attending: Internal Medicine

## 2019-08-31 DIAGNOSIS — Z20822 Contact with and (suspected) exposure to covid-19: Secondary | ICD-10-CM

## 2019-09-01 LAB — NOVEL CORONAVIRUS, NAA: SARS-CoV-2, NAA: NOT DETECTED

## 2019-10-04 ENCOUNTER — Other Ambulatory Visit (HOSPITAL_COMMUNITY)
Admission: RE | Admit: 2019-10-04 | Discharge: 2019-10-04 | Disposition: A | Discharge status: Home / Self Care | Payer: 59 | Source: Ambulatory Visit | Attending: Family Medicine | Admitting: Family Medicine

## 2019-10-04 ENCOUNTER — Ambulatory Visit (INDEPENDENT_AMBULATORY_CARE_PROVIDER_SITE_OTHER): Payer: 59 | Admitting: Family Medicine

## 2019-10-04 ENCOUNTER — Other Ambulatory Visit: Payer: Self-pay

## 2019-10-04 VITALS — BP 98/70 | HR 98 | Temp 98.1°F | Wt 163.0 lb

## 2019-10-04 DIAGNOSIS — Z113 Encounter for screening for infections with a predominantly sexual mode of transmission: Secondary | ICD-10-CM

## 2019-10-04 DIAGNOSIS — N898 Other specified noninflammatory disorders of vagina: Secondary | ICD-10-CM | POA: Insufficient documentation

## 2019-10-04 DIAGNOSIS — Z0000 Encounter for general adult medical examination without abnormal findings: Secondary | ICD-10-CM

## 2019-10-04 NOTE — Patient Instructions (Addendum)
Preventive Care 21-27 Years Old, Female Preventive care refers to visits with your health care provider and lifestyle choices that can promote health and wellness. This includes:  A yearly physical exam. This may also be called an annual well check.  Regular dental visits and eye exams.  Immunizations.  Screening for certain conditions.  Healthy lifestyle choices, such as eating a healthy diet, getting regular exercise, not using drugs or products that contain nicotine and tobacco, and limiting alcohol use. What can I expect for my preventive care visit? Physical exam Your health care provider will check your:  Height and weight. This may be used to calculate body mass index (BMI), which tells if you are at a healthy weight.  Heart rate and blood pressure.  Skin for abnormal spots. Counseling Your health care provider may ask you questions about your:  Alcohol, tobacco, and drug use.  Emotional well-being.  Home and relationship well-being.  Sexual activity.  Eating habits.  Work and work environment.  Method of birth control.  Menstrual cycle.  Pregnancy history. What immunizations do I need?  Influenza (flu) vaccine  This is recommended every year. Tetanus, diphtheria, and pertussis (Tdap) vaccine  You may need a Td booster every 10 years. Varicella (chickenpox) vaccine  You may need this if you have not been vaccinated. Human papillomavirus (HPV) vaccine  If recommended by your health care provider, you may need three doses over 6 months. Measles, mumps, and rubella (MMR) vaccine  You may need at least one dose of MMR. You may also need a second dose. Meningococcal conjugate (MenACWY) vaccine  One dose is recommended if you are age 19-21 years and a first-year college student living in a residence hall, or if you have one of several medical conditions. You may also need additional booster doses. Pneumococcal conjugate (PCV13) vaccine  You may need  this if you have certain conditions and were not previously vaccinated. Pneumococcal polysaccharide (PPSV23) vaccine  You may need one or two doses if you smoke cigarettes or if you have certain conditions. Hepatitis A vaccine  You may need this if you have certain conditions or if you travel or work in places where you may be exposed to hepatitis A. Hepatitis B vaccine  You may need this if you have certain conditions or if you travel or work in places where you may be exposed to hepatitis B. Haemophilus influenzae type b (Hib) vaccine  You may need this if you have certain conditions. You may receive vaccines as individual doses or as more than one vaccine together in one shot (combination vaccines). Talk with your health care provider about the risks and benefits of combination vaccines. What tests do I need?  Blood tests  Lipid and cholesterol levels. These may be checked every 5 years starting at age 20.  Hepatitis C test.  Hepatitis B test. Screening  Diabetes screening. This is done by checking your blood sugar (glucose) after you have not eaten for a while (fasting).  Sexually transmitted disease (STD) testing.  BRCA-related cancer screening. This may be done if you have a family history of breast, ovarian, tubal, or peritoneal cancers.  Pelvic exam and Pap test. This may be done every 3 years starting at age 21. Starting at age 30, this may be done every 5 years if you have a Pap test in combination with an HPV test. Talk with your health care provider about your test results, treatment options, and if necessary, the need for more tests.   Follow these instructions at home: Eating and drinking   Eat a diet that includes fresh fruits and vegetables, whole grains, lean protein, and low-fat dairy.  Take vitamin and mineral supplements as recommended by your health care provider.  Do not drink alcohol if: ? Your health care provider tells you not to drink. ? You are  pregnant, may be pregnant, or are planning to become pregnant.  If you drink alcohol: ? Limit how much you have to 0-1 drink a day. ? Be aware of how much alcohol is in your drink. In the [place] one drink equals one 12 oz bottle of beer (355 mL), one 5 oz glass of wine (148 mL), or one 1 oz glass of hard liquor (44 mL). Lifestyle  Take daily care of your teeth and gums.  Stay active. Exercise for at least 30 minutes on 5 or more days each week.  Do not use any products that contain nicotine or tobacco, such as cigarettes, e-cigarettes, and chewing tobacco. If you need help quitting, ask your health care provider.  If you are sexually active, practice safe sex. Use a condom or other form of birth control (contraception) in order to prevent pregnancy and STIs (sexually transmitted infections). If you plan to become pregnant, see your health care provider for a preconception visit. What's next?  Visit your health care provider once a year for a well check visit.  Ask your health care provider how often you should have your eyes and teeth checked.  Stay up to date on all vaccines. This information is not intended to replace advice given to you by your health care provider. Make sure you discuss any questions you have with your health care provider. Document Revised: 03/30/2018 Document Reviewed: 03/30/2018 Elsevier Patient Education  2020 [employer]  Calorie Counting for Weight Loss Calories are units of energy. Your body needs a certain amount of calories from food to keep you going throughout the day. When you eat more calories than your body needs, your body stores the extra calories as fat. When you eat fewer calories than your body needs, your body burns fat to get the energy it needs. Calorie counting means keeping track of how many calories you eat and drink each day. Calorie counting can be helpful if you need to lose weight. If you make sure to eat fewer calories than your body  needs, you should lose weight. Ask your health care provider what a healthy weight is for you. For calorie counting to work, you will need to eat the right number of calories in a day in order to lose a healthy amount of weight per week. A dietitian can help you determine how many calories you need in a day and will give you suggestions on how to reach your calorie goal.  A healthy amount of weight to lose per week is usually 1-2 lb (0.5-0.9 kg). This usually means that your daily calorie intake should be reduced by 500-750 calories.  Eating 1,200 - 1,500 calories per day can help most women lose weight.  Eating 1,500 - 1,800 calories per day can help most men lose weight. What is my plan? My goal is to have __________ calories per day. If I have this many calories per day, I should lose around __________ pounds per week. What do I need to know about calorie counting? In order to meet your daily calorie goal, you will need to:  Find out how many calories are in each food   you would like to eat. Try to do this before you eat.  Decide how much of the food you plan to eat.  Write down what you ate and how many calories it had. Doing this is called keeping a food log. To successfully lose weight, it is important to balance calorie counting with a healthy lifestyle that includes regular activity. Aim for 150 minutes of moderate exercise (such as walking) or 75 minutes of vigorous exercise (such as running) each week. Where do I find calorie information?  The number of calories in a food can be found on a Nutrition Facts label. If a food does not have a Nutrition Facts label, try to look up the calories online or ask your dietitian for help. Remember that calories are listed per serving. If you choose to have more than one serving of a food, you will have to multiply the calories per serving by the amount of servings you plan to eat. For example, the label on a package of bread might say that a  serving size is 1 slice and that there are 90 calories in a serving. If you eat 1 slice, you will have eaten 90 calories. If you eat 2 slices, you will have eaten 180 calories. How do I keep a food log? Immediately after each meal, record the following information in your food log:  What you ate. Don't forget to include toppings, sauces, and other extras on the food.  How much you ate. This can be measured in cups, ounces, or number of items.  How many calories each food and drink had.  The total number of calories in the meal. Keep your food log near you, such as in a small notebook in your pocket, or use a mobile app or website. Some programs will calculate calories for you and show you how many calories you have left for the day to meet your goal. What are some calorie counting tips?   Use your calories on foods and drinks that will fill you up and not leave you hungry: ? Some examples of foods that fill you up are nuts and nut butters, vegetables, lean proteins, and high-fiber foods like whole grains. High-fiber foods are foods with more than 5 g fiber per serving. ? Drinks such as sodas, specialty coffee drinks, alcohol, and juices have a lot of calories, yet do not fill you up.  Eat nutritious foods and avoid empty calories. Empty calories are calories you get from foods or beverages that do not have many vitamins or protein, such as candy, sweets, and soda. It is better to have a nutritious high-calorie food (such as an avocado) than a food with few nutrients (such as a bag of chips).  Know how many calories are in the foods you eat most often. This will help you calculate calorie counts faster.  Pay attention to calories in drinks. Low-calorie drinks include water and unsweetened drinks.  Pay attention to nutrition labels for "low fat" or "fat free" foods. These foods sometimes have the same amount of calories or more calories than the full fat versions. They also often have added  sugar, starch, or salt, to make up for flavor that was removed with the fat.  Find a way of tracking calories that works for you. Get creative. Try different apps or programs if writing down calories does not work for you. What are some portion control tips?  Know how many calories are in a serving. This will help you  know how many servings of a certain food you can have.  Use a measuring cup to measure serving sizes. You could also try weighing out portions on a kitchen scale. With time, you will be able to estimate serving sizes for some foods.  Take some time to put servings of different foods on your favorite plates, bowls, and cups so you know what a serving looks like.  Try not to eat straight from a bag or box. Doing this can lead to overeating. Put the amount you would like to eat in a cup or on a plate to make sure you are eating the right portion.  Use smaller plates, glasses, and bowls to prevent overeating.  Try not to multitask (for example, watch TV or use your computer) while eating. If it is time to eat, sit down at a table and enjoy your food. This will help you to know when you are full. It will also help you to be aware of what you are eating and how much you are eating. What are tips for following this plan? Reading food labels  Check the calorie count compared to the serving size. The serving size may be smaller than what you are used to eating.  Check the source of the calories. Make sure the food you are eating is high in vitamins and protein and low in saturated and trans fats. Shopping  Read nutrition labels while you shop. This will help you make healthy decisions before you decide to purchase your food.  Make a grocery list and stick to it. Cooking  Try to cook your favorite foods in a healthier way. For example, try baking instead of frying.  Use low-fat dairy products. Meal planning  Use more fruits and vegetables. Half of your plate should be fruits and  vegetables.  Include lean proteins like poultry and fish. How do I count calories when eating out?  Ask for smaller portion sizes.  Consider sharing an entree and sides instead of getting your own entree.  If you get your own entree, eat only half. Ask for a box at the beginning of your meal and put the rest of your entree in it so you are not tempted to eat it.  If calories are listed on the menu, choose the lower calorie options.  Choose dishes that include vegetables, fruits, whole grains, low-fat dairy products, and lean protein.  Choose items that are boiled, broiled, grilled, or steamed. Stay away from items that are buttered, battered, fried, or served with cream sauce. Items labeled "crispy" are usually fried, unless stated otherwise.  Choose water, low-fat milk, unsweetened iced tea, or other drinks without added sugar. If you want an alcoholic beverage, choose a lower calorie option such as a glass of wine or light beer.  Ask for dressings, sauces, and syrups on the side. These are usually high in calories, so you should limit the amount you eat.  If you want a salad, choose a garden salad and ask for grilled meats. Avoid extra toppings like bacon, cheese, or fried items. Ask for the dressing on the side, or ask for olive oil and vinegar or lemon to use as dressing.  Estimate how many servings of a food you are given. For example, a serving of cooked rice is  cup or about the size of half a baseball. Knowing serving sizes will help you be aware of how much food you are eating at restaurants. The list below tells you how big   or small some common portion sizes are based on everyday objects: ? 1 oz--4 stacked dice. ? 3 oz--1 deck of cards. ? 1 tsp--1 die. ? 1 Tbsp-- a ping-pong ball. ? 2 Tbsp--1 ping-pong ball. ?  cup-- baseball. ? 1 cup--1 baseball. Summary  Calorie counting means keeping track of how many calories you eat and drink each day. If you eat fewer calories  than your body needs, you should lose weight.  A healthy amount of weight to lose per week is usually 1-2 lb (0.5-0.9 kg). This usually means reducing your daily calorie intake by 500-750 calories.  The number of calories in a food can be found on a Nutrition Facts label. If a food does not have a Nutrition Facts label, try to look up the calories online or ask your dietitian for help.  Use your calories on foods and drinks that will fill you up, and not on foods and drinks that will leave you hungry.  Use smaller plates, glasses, and bowls to prevent overeating. This information is not intended to replace advice given to you by your health care provider. Make sure you discuss any questions you have with your health care provider. Document Revised: 04/07/2018 Document Reviewed: 06/18/2016 Elsevier Patient Education  2020 [employer].  Exercising to Lose Weight Exercise is structured, repetitive physical activity to improve fitness and health. Getting regular exercise is important for everyone. It is especially important if you are overweight. Being overweight increases your risk of heart disease, stroke, diabetes, high blood pressure, and several types of cancer. Reducing your calorie intake and exercising can help you lose weight. Exercise is usually categorized as moderate or vigorous intensity. To lose weight, most people need to do a certain amount of moderate-intensity or vigorous-intensity exercise each week. Moderate-intensity exercise  Moderate-intensity exercise is any activity that gets you moving enough to burn at least three times more energy (calories) than if you were sitting. Examples of moderate exercise include:  Walking a mile in 15 minutes.  Doing light yard work.  Biking at an easy pace. Most people should get at least 150 minutes (2 hours and 30 minutes) a week of moderate-intensity exercise to maintain their body weight. Vigorous-intensity  exercise Vigorous-intensity exercise is any activity that gets you moving enough to burn at least six times more calories than if you were sitting. When you exercise at this intensity, you should be working hard enough that you are not able to carry on a conversation. Examples of vigorous exercise include:  Running.  Playing a team sport, such as football, basketball, and soccer.  Jumping rope. Most people should get at least 75 minutes (1 hour and 15 minutes) a week of vigorous-intensity exercise to maintain their body weight. How can exercise affect me? When you exercise enough to burn more calories than you eat, you lose weight. Exercise also reduces body fat and builds muscle. The more muscle you have, the more calories you burn. Exercise also:  Improves mood.  Reduces stress and tension.  Improves your overall fitness, flexibility, and endurance.  Increases bone strength. The amount of exercise you need to lose weight depends on:  Your age.  The type of exercise.  Any health conditions you have.  Your overall physical ability. Talk to your health care provider about how much exercise you need and what types of activities are safe for you. What actions can I take to lose weight? Nutrition   Make changes to your diet as told by your   health care provider or diet and nutrition specialist (dietitian). This may include: ? Eating fewer calories. ? Eating more protein. ? Eating less unhealthy fats. ? Eating a diet that includes fresh fruits and vegetables, whole grains, low-fat dairy products, and lean protein. ? Avoiding foods with added fat, salt, and sugar.  Drink plenty of water while you exercise to prevent dehydration or heat stroke. Activity  Choose an activity that you enjoy and set realistic goals. Your health care provider can help you make an exercise plan that works for you.  Exercise at a moderate or vigorous intensity most days of the week. ? The intensity of  exercise may vary from person to person. You can tell how intense a workout is for you by paying attention to your breathing and heartbeat. Most people will notice their breathing and heartbeat get faster with more intense exercise.  Do resistance training twice each week, such as: ? Push-ups. ? Sit-ups. ? Lifting weights. ? Using resistance bands.  Getting short amounts of exercise can be just as helpful as long structured periods of exercise. If you have trouble finding time to exercise, try to include exercise in your daily routine. ? Get up, stretch, and walk around every 30 minutes throughout the day. ? Go for a walk during your lunch break. ? Park your car farther away from your destination. ? If you take public transportation, get off one stop early and walk the rest of the way. ? Make phone calls while standing up and walking around. ? Take the stairs instead of elevators or escalators.  Wear comfortable clothes and shoes with good support.  Do not exercise so much that you hurt yourself, feel dizzy, or get very short of breath. Where to find more information  [place] Department of Health and Human Services: www.hhs.gov  Centers for Disease Control and Prevention (CDC): www.cdc.gov Contact a health care provider:  Before starting a new exercise program.  If you have questions or concerns about your weight.  If you have a medical problem that keeps you from exercising. Get help right away if you have any of the following while exercising:  Injury.  Dizziness.  Difficulty breathing or shortness of breath that does not go away when you stop exercising.  Chest pain.  Rapid heartbeat. Summary  Being overweight increases your risk of heart disease, stroke, diabetes, high blood pressure, and several types of cancer.  Losing weight happens when you burn more calories than you eat.  Reducing the amount of calories you eat in addition to getting regular moderate or vigorous  exercise each week helps you lose weight. This information is not intended to replace advice given to you by your health care provider. Make sure you discuss any questions you have with your health care provider. Document Revised: 08/01/2017 Document Reviewed: 08/01/2017 Elsevier Patient Education  2020 [employer].  

## 2019-10-04 NOTE — Progress Notes (Signed)
Subjective:     Tracy Haynes is a 29 y.o. female and is here for a comprehensive physical exam. The patient reports problems - Vaginal discharge.  Noticed much discharge with mild irritation.  Denies dysuria, suprapubic pain, nausea, vomiting, back pain, fever, chills.  Symptoms possibly caused by yeast location.  LMP 09/22/2019.  Pap done 07/2018.  Pt requesting STI testing.  Social History   Socioeconomic History  . Marital status: Single    Spouse name: Not on file  . Number of children: 1  . Years of education: Not on file  . Highest education level: Master's degree (e.g., MA, MS, MEng, MEd, MSW, MBA)  Occupational History  . Not on file  Tobacco Use  . Smoking status: Never Smoker  . Smokeless tobacco: Never Used  Substance and Sexual Activity  . Alcohol use: No    Comment: quit 2018  . Drug use: No  . Sexual activity: Yes    Birth control/protection: Implant    Comment: NEXPLANON  Other Topics Concern  . Not on file  Social History Narrative   Lives at home with her daughter   Right handed   Quit caffeine February 2019.   Social Determinants of Health   Financial Resource Strain:   . Difficulty of Paying Living Expenses: Not on file  Food Insecurity:   . Worried About Programme researcher, broadcasting/film/video in the Last Year: Not on file  . Ran Out of Food in the Last Year: Not on file  Transportation Needs:   . Lack of Transportation (Medical): Not on file  . Lack of Transportation (Non-Medical): Not on file  Physical Activity:   . Days of Exercise per Week: Not on file  . Minutes of Exercise per Session: Not on file  Stress:   . Feeling of Stress : Not on file  Social Connections:   . Frequency of Communication with Friends and Family: Not on file  . Frequency of Social Gatherings with Friends and Family: Not on file  . Attends Religious Services: Not on file  . Active Member of Clubs or Organizations: Not on file  . Attends Banker Meetings: Not on file   . Marital Status: Not on file  Intimate Partner Violence:   . Fear of Current or Ex-Partner: Not on file  . Emotionally Abused: Not on file  . Physically Abused: Not on file  . Sexually Abused: Not on file   Health Maintenance  Topic Date Due  . PAP-Cervical Cytology Screening  07/18/2021  . PAP SMEAR-Modifier  07/18/2021  . TETANUS/TDAP  12/17/2024  . INFLUENZA VACCINE  Completed  . HIV Screening  Completed    The following portions of the patient's history were reviewed and updated as appropriate: allergies, current medications, past family history, past medical history, past social history, past surgical history and problem list.  Review of Systems Pertinent items noted in HPI and remainder of comprehensive ROS otherwise negative.   Objective:    BP 98/70   Pulse 98   Temp 98.1 F (36.7 C)   Wt 163 lb (73.9 kg)   SpO2 97%   BMI 30.80 kg/m  General appearance: alert, cooperative and no distress Head: Normocephalic, without obvious abnormality, atraumatic Eyes: conjunctivae/corneas clear. PERRL, EOM's intact. Fundi benign. Ears: normal TM's and external ear canals both ears Nose: Nares normal. Septum midline. Mucosa normal. No drainage or sinus tenderness. Throat: lips, mucosa, and tongue normal; teeth and gums normal Neck: no adenopathy, no carotid bruit,  no JVD, supple, symmetrical, trachea midline and thyroid not enlarged, symmetric, no tenderness/mass/nodules Lungs: clear to auscultation bilaterally Heart: regular rate and rhythm, S1, S2 normal, no murmur, click, rub or gallop Abdomen: soft, non-tender; bowel sounds normal; no masses,  no organomegaly Pelvic: Patient collected self swab obtained Extremities: extremities normal, atraumatic, no cyanosis or edema Pulses: 2+ and symmetric Skin: Skin color, texture, turgor normal. No rashes or lesions Lymph nodes: Cervical, supraclavicular, and axillary nodes normal. Neurologic: Alert and oriented X 3, normal strength  and tone. Normal symmetric reflexes. Normal coordination and gait    Assessment:    Healthy female exam.      Plan:     Anticipatory guidance given including wearing seatbelts, smoke detectors in the home, increasing physical activity, increasing p.o. intake of water and vegetables. -Pap up-to-date.  Done 07/2018.  Due 07/2021 -We will obtain STI testing.  Declines other labs at this time -Given handout -Next CPE in 1 year See After Visit Summary for Counseling Recommendations    Vaginal discharge -We will send in Rx if needed based on labs - Plan: Cervicovaginal ancillary only  Routine screening for STI (sexually transmitted infection)  - Plan: HIV antibody (with reflex), RPR  Follow-up as needed  Grier Mitts, MD

## 2019-10-05 ENCOUNTER — Encounter: Payer: Self-pay | Admitting: Family Medicine

## 2019-10-05 LAB — HIV ANTIBODY (ROUTINE TESTING W REFLEX): HIV 1&2 Ab, 4th Generation: NONREACTIVE

## 2019-10-05 LAB — RPR: RPR Ser Ql: NONREACTIVE

## 2019-10-08 LAB — CERVICOVAGINAL ANCILLARY ONLY
Bacterial Vaginitis (gardnerella): NEGATIVE
Candida Glabrata: NEGATIVE
Candida Vaginitis: NEGATIVE
Chlamydia: NEGATIVE
Comment: NEGATIVE
Comment: NEGATIVE
Comment: NEGATIVE
Comment: NEGATIVE
Comment: NEGATIVE
Comment: NORMAL
Neisseria Gonorrhea: NEGATIVE
Trichomonas: NEGATIVE

## 2019-10-15 ENCOUNTER — Encounter: Payer: Self-pay | Admitting: Family Medicine

## 2019-10-15 ENCOUNTER — Telehealth (INDEPENDENT_AMBULATORY_CARE_PROVIDER_SITE_OTHER): Payer: 59 | Admitting: Family Medicine

## 2019-10-15 ENCOUNTER — Telehealth: Payer: Self-pay | Admitting: Family Medicine

## 2019-10-15 DIAGNOSIS — Z7721 Contact with and (suspected) exposure to potentially hazardous body fluids: Secondary | ICD-10-CM | POA: Diagnosis not present

## 2019-10-15 DIAGNOSIS — K1379 Other lesions of oral mucosa: Secondary | ICD-10-CM | POA: Diagnosis not present

## 2019-10-15 NOTE — Telephone Encounter (Signed)
Pt would like a call back she has questions about STD testing.

## 2019-10-15 NOTE — Progress Notes (Signed)
Virtual Visit via Video Note  I connected with Farrel Gordon on 10/15/19 at  9:30 AM EDT by a video enabled telemedicine application 2/2 COVID-19 pandemic and verified that I am speaking with the correct person using two identifiers.  Location patient: home Location provider:work or home office Persons participating in the virtual visit: patient, provider  I discussed the limitations of evaluation and management by telemedicine and the availability of in person appointments. The patient expressed understanding and agreed to proceed.   HPI: Pt states she accidentally used her best friend's toothbrush during spring break as they have identical toothbrushes.  Pt was concerned she may need STI testing.  Pt does not think her friend has cold sores, but states she is not sure.  Pt has a small bump in the inside of her mouth, R cheek.  Pt is unsure if the bump is from using the toothbrush or from biting the inside of her mouth when she chew gum.  The area is mildly sore.  Pt denies other lesions, sore throat, cough, fever, fatigue, h/o cold sores.  Pt had negative STI testing on 10/04/19.  ROS: See pertinent positives and negatives per HPI.  Past Medical History:  Diagnosis Date  . Chlamydia infection   . Gonorrhea   . H/O varicella   . Hx: UTI (urinary tract infection)     Past Surgical History:  Procedure Laterality Date  . WISDOM TOOTH EXTRACTION      Family History  Problem Relation Age of Onset  . Hypertension Mother   . Arthritis Mother   . Thyroid disease Mother   . Asthma Brother   . Thalassemia Brother   . Alcohol abuse Father   . Diabetes Maternal Aunt   . Anesthesia problems Neg Hx     No current outpatient medications on file.  EXAM:  VITALS per patient if applicable:  RR between 12-20 bpm  GENERAL: alert, oriented, appears well and in no acute distress  HEENT: atraumatic, conjunctiva clear, no obvious abnormalities on inspection of external nose and ears.   Inside of R cheek near molars with small pustule and erythema.  Gingiva without erythema or inflammation.  No lesions on tongue.   NECK: normal movements of the head and neck  LUNGS: on inspection no signs of respiratory distress, breathing rate appears normal, no obvious gross SOB, gasping or wheezing  CV: no obvious cyanosis  MS: moves all visible extremities without noticeable abnormality  PSYCH/NEURO: pleasant and cooperative, no obvious depression or anxiety, speech and thought processing grossly intact  ASSESSMENT AND PLAN:  Discussed the following assessment and plan:  Sore in mouth -area 2/2 biting cheek and/or viral etiology -supportive care: gargling with warm salt water -discussed testing options  Exposure to blood or body fluid -accidentally used friend's toothbrush -STI labs negative 10/04/19 including HIV -discussed retesting and obtaining HCV. -Can test covered lesion in mouth for HSV, but would caution against testing for HSV in blood as could be postitive from a previous infection including cold sores.  F/u prn   I discussed the assessment and treatment plan with the patient. The patient was provided an opportunity to ask questions and all were answered. The patient agreed with the plan and demonstrated an understanding of the instructions.   The patient was advised to call back or seek an in-person evaluation if the symptoms worsen or if the condition fails to improve as anticipated.  Deeann Saint, MD

## 2019-10-15 NOTE — Telephone Encounter (Signed)
Spoke with pt scheduled for a virtual visit with  Dr Salomon Fick this morning 10/15/2019

## 2019-12-17 ENCOUNTER — Other Ambulatory Visit: Payer: Self-pay

## 2019-12-17 ENCOUNTER — Ambulatory Visit (INDEPENDENT_AMBULATORY_CARE_PROVIDER_SITE_OTHER): Payer: 59 | Admitting: Family Medicine

## 2019-12-17 ENCOUNTER — Encounter: Payer: Self-pay | Admitting: Family Medicine

## 2019-12-17 ENCOUNTER — Other Ambulatory Visit (HOSPITAL_COMMUNITY)
Admission: RE | Admit: 2019-12-17 | Discharge: 2019-12-17 | Disposition: A | Discharge status: Home / Self Care | Payer: 59 | Source: Ambulatory Visit | Attending: Family Medicine | Admitting: Family Medicine

## 2019-12-17 VITALS — BP 110/80 | HR 76 | Temp 97.7°F | Wt 167.0 lb

## 2019-12-17 DIAGNOSIS — Z113 Encounter for screening for infections with a predominantly sexual mode of transmission: Secondary | ICD-10-CM

## 2019-12-17 DIAGNOSIS — N760 Acute vaginitis: Secondary | ICD-10-CM | POA: Diagnosis not present

## 2019-12-17 NOTE — Patient Instructions (Signed)

## 2019-12-17 NOTE — Progress Notes (Signed)
Subjective:    Patient ID: Tracy Haynes, female    DOB: [birth year], 29 y.o.   MRN: [id number]  No chief complaint on file.   HPI Patient was seen today for f/u.  Pt interested in STI testing.  Notes clear vaginal d/c x a few days and mild irritation.  Notes changing to a different scent of dove soap.  Denies dysuria, back pain, nausea, vomiting, fever.  Of note pt exposed to bodily fluids after mistakingly using her best friend's toothbrush.  Since incident pt denies any mouth sores..  Past Medical History:  Diagnosis Date  . Chlamydia infection   . Gonorrhea   . H/O varicella   . Hx: UTI (urinary tract infection)     Allergies  Allergen Reactions  . Blueberry [Vaccinium Angustifolium] Nausea And Vomiting    ROS General: Denies fever, chills, night sweats, changes in weight, changes in appetite HEENT: Denies headaches, ear pain, changes in vision, rhinorrhea, sore throat CV: Denies CP, palpitations, SOB, orthopnea Pulm: Denies SOB, cough, wheezing GI: Denies abdominal pain, nausea, vomiting, diarrhea, constipation GU: Denies dysuria, hematuria, frequency   +vaginal irritation, clear vaginal d/c Msk: Denies muscle cramps, joint pains Neuro: Denies weakness, numbness, tingling Skin: Denies rashes, bruising Psych: Denies depression, anxiety, hallucinations     Objective:    Blood pressure 110/80, pulse 76, temperature 97.7 F (36.5 C), temperature source Temporal, weight 167 lb (75.8 kg), last menstrual period 12/13/2019, SpO2 97 %.  Gen. Pleasant, well-nourished, in no distress, normal affect   HEENT: [place]/AT, face symmetric, no scleral icterus, PERRLA, EOMI, nares patent without drainage Lungs: no accessory muscle use Cardiovascular: RRR, no peripheral edema GU: self swab obtained Neuro:  A&Ox3, CN II-XII intact, normal gait Skin:  Warm, no lesions/ rash   Wt Readings from Last 3 Encounters:  12/17/19 167 lb (75.8 kg)  10/05/19 163 lb (73.9 kg)  06/05/19 166  lb 9.6 oz (75.6 kg)    Lab Results  Component Value Date   WBC 7.9 06/16/2018   HGB 13.5 06/16/2018   HCT 40.8 06/16/2018   PLT 367.0 06/16/2018   GLUCOSE 82 06/16/2018   CHOL 182 06/16/2018   TRIG 46.0 06/16/2018   HDL 53.10 06/16/2018   LDLCALC 120 (H) 06/16/2018   ALT 29 11/04/2017   AST 27 11/04/2017   NA 137 06/16/2018   K 4.8 06/16/2018   CL 103 06/16/2018   CREATININE 0.63 06/16/2018   BUN 10 06/16/2018   CO2 29 06/16/2018   TSH 2.61 06/16/2018   HGBA1C 5.4 06/16/2018    Assessment/Plan:  Routine screening for STI (sexually transmitted infection)  - Plan: HIV antibody (with reflex), RPR, Cervicovaginal ancillary only, Hep C Antibody  Vaginitis -likely 2/2 change in soap -discussed possible causes infection, products, meds, etc -given handout -cervicovaginal ancillary self swab obtained.  Will treat if needed based on labs.  F/u prn  Abbe Amsterdam, MD

## 2019-12-18 ENCOUNTER — Other Ambulatory Visit: Payer: Self-pay | Admitting: Family Medicine

## 2019-12-18 DIAGNOSIS — B3731 Acute candidiasis of vulva and vagina: Secondary | ICD-10-CM

## 2019-12-18 LAB — CERVICOVAGINAL ANCILLARY ONLY
Candida Glabrata: NEGATIVE
Candida Vaginitis: POSITIVE — AB
Chlamydia: NEGATIVE
Comment: NEGATIVE
Comment: NEGATIVE
Comment: NEGATIVE
Comment: NEGATIVE
Comment: NORMAL
Neisseria Gonorrhea: NEGATIVE
Trichomonas: NEGATIVE

## 2019-12-18 LAB — HEPATITIS C ANTIBODY
Hepatitis C Ab: NONREACTIVE
SIGNAL TO CUT-OFF: 0.02 (ref <1.00)

## 2019-12-18 LAB — HIV ANTIBODY (ROUTINE TESTING W REFLEX): HIV 1&2 Ab, 4th Generation: NONREACTIVE

## 2019-12-18 LAB — RPR: RPR Ser Ql: NONREACTIVE

## 2019-12-18 MED ORDER — FLUCONAZOLE 150 MG PO TABS: ORAL_TABLET | ORAL | 0 refills | Status: DC

## 2020-01-11 ENCOUNTER — Ambulatory Visit (INDEPENDENT_AMBULATORY_CARE_PROVIDER_SITE_OTHER): Payer: 59 | Admitting: Family Medicine

## 2020-01-11 ENCOUNTER — Encounter: Payer: Self-pay | Admitting: Family Medicine

## 2020-01-11 ENCOUNTER — Other Ambulatory Visit: Payer: 59

## 2020-01-11 ENCOUNTER — Other Ambulatory Visit: Payer: Self-pay

## 2020-01-11 VITALS — BP 110/78 | HR 76 | Temp 97.9°F | Wt 169.0 lb

## 2020-01-11 DIAGNOSIS — J020 Streptococcal pharyngitis: Secondary | ICD-10-CM

## 2020-01-11 DIAGNOSIS — Z711 Person with feared health complaint in whom no diagnosis is made: Secondary | ICD-10-CM

## 2020-01-11 MED ORDER — AMOXICILLIN-POT CLAVULANATE 875-125 MG PO TABS: 1.0000 | ORAL_TABLET | Freq: Two times a day (BID) | ORAL | 0 refills | Status: AC

## 2020-01-11 NOTE — Addendum Note (Signed)
Addended by: Carola Rhine on: 01/11/2020 03:24 PM   Modules accepted: Orders

## 2020-01-11 NOTE — Patient Instructions (Addendum)
Strep Throat, Adult Strep throat is an infection of the throat. It is caused by germs (bacteria). Strep throat is common during the cold months of the year. It mostly affects children who are 5-27 years old. However, people of all ages can get it at any time of the year. When strep throat affects the tonsils, it is called tonsillitis. When it affects the back of the throat, it is called pharyngitis. This infection spreads from person to person through coughing, sneezing, or having close contact. What are the causes? This condition is caused by the Streptococcus pyogenes germ. What increases the risk? You are more likely to develop this condition if:  You care for young children. Children are more likely to get strep throat and may spread it to others.  You go to crowded places. Germs can spread easily in such places.  You kiss or touch someone who has strep throat. What are the signs or symptoms? Symptoms of this condition include:  Fever or chills.  Redness, swelling, or pain in the tonsils or throat.  Pain or trouble when swallowing.  White or yellow spots on the tonsils or throat.  Tender glands in the neck and under the jaw.  Bad breath.  Red rash all over the body. This is rare. How is this treated? This condition may be treated with:  Medicines that kill germs (antibiotics).  Medicines that treat pain or fever. These include: ? Ibuprofen or acetaminophen. ? Aspirin, only for patients who are over the age of 18. ? Throat lozenges. ? Throat sprays. Follow these instructions at home: Medicines   Take over-the-counter and prescription medicines only as told by your doctor.  Take your antibiotic medicine as told by your doctor. Do not stop taking the antibiotic even if you start to feel better. Eating and drinking   If you have trouble swallowing, eat soft foods until your throat feels better.  Drink enough fluid to keep your pee (urine) pale yellow.  To help  with pain, you may have: ? Warm fluids, such as soup and tea. ? Cold fluids, such as frozen desserts or popsicles. General instructions  Rinse your mouth (gargle) with a salt-water mixture 3-4 times a day or as needed. To make a salt-water mixture, dissolve -1 tsp (3-6 g) of salt in 1 cup (237 mL) of warm water.  Rest as much as you can.  Stay home from work or school until you have been taking antibiotics for 24 hours.  Avoid smoking or being around people who smoke.  Keep all follow-up visits as told by your doctor. This is important. How is this prevented?   Do not share food, drinking cups, or personal items. They can cause the germs to spread.  Wash your hands well with soap and water. Make sure that all people in your house wash their hands well.  Have family members tested if they have a fever or a sore throat. They may need an antibiotic if they have strep throat. Contact a doctor if:  You have swelling in your neck that keeps getting bigger.  You get a rash, cough, or earache.  You cough up a thick fluid that is green, yellow-brown, or bloody.  You have pain that does not get better with medicine.  Your symptoms get worse instead of getting better.  You have a fever. Get help right away if:  You vomit.  You have a very bad headache.  Your neck hurts or feels stiff.  You have   chest pain or are short of breath.  You have drooling, very bad throat pain, or changes in your voice.  Your neck is swollen, or the skin gets red and tender.  Your mouth is dry, or you are peeing less than normal.  You keep feeling more tired or have trouble waking up.  Your joints are red or painful. Summary  Strep throat is an infection of the throat. It is caused by germs (bacteria).  This infection can spread from person to person through coughing, sneezing, or having close contact.  Take your medicines, including antibiotics, as told by your doctor. Do not stop taking  the antibiotic even if you start to feel better.  To prevent the spread of germs, wash your hands well with soap and water. Have others do the same. Do not share food, drinking cups, or personal items.  Get help right away if you have a bad headache, chest pain, shortness of breath, a stiff or painful neck, or you vomit. This information is not intended to replace advice given to you by your health care provider. Make sure you discuss any questions you have with your health care provider. Document Revised: 10/06/2018 Document Reviewed: 10/06/2018 Elsevier Patient Education  2020 [employer]  

## 2020-01-11 NOTE — Addendum Note (Signed)
Addended by: Carola Rhine on: 01/11/2020 03:22 PM   Modules accepted: Orders

## 2020-01-11 NOTE — Addendum Note (Signed)
Addended by: Carola Rhine on: 01/11/2020 03:28 PM   Modules accepted: Orders

## 2020-01-11 NOTE — Progress Notes (Signed)
Subjective:    Patient ID: Tracy Haynes, female    DOB: 19-Dec-1990, 29 y.o.   MRN: [id number]  No chief complaint on file.   HPI Patient was seen today for acute concern.  Patient endorses sore throat x3 days.  Denies fever, chills, nausea, vomiting.  Pt inquires whether she needs HSV testing as she inadvertently used her declines her toothbrush several months ago and recently found out her friend has genital HSV.  Pt denies any outbreaks or symptoms.  Past Medical History:  Diagnosis Date  . Chlamydia infection   . Gonorrhea   . H/O varicella   . Hx: UTI (urinary tract infection)     Allergies  Allergen Reactions  . Blueberry [Vaccinium Angustifolium] Nausea And Vomiting    ROS General: Denies fever, chills, night sweats, changes in weight, changes in appetite HEENT: Denies headaches, ear pain, changes in vision, rhinorrhea  +sore throat CV: Denies CP, palpitations, SOB, orthopnea Pulm: Denies SOB, cough, wheezing GI: Denies abdominal pain, nausea, vomiting, diarrhea, constipation GU: Denies dysuria, hematuria, frequency, vaginal discharge Msk: Denies muscle cramps, joint pains Neuro: Denies weakness, numbness, tingling Skin: Denies rashes, bruising Psych: Denies depression, anxiety, hallucinations      Objective:    Blood pressure 110/78, pulse 76, temperature 97.9 F (36.6 C), temperature source Temporal, weight 169 lb (76.7 kg), last menstrual period 12/13/2019, SpO2 98 %.   Gen. Pleasant, well-nourished, in no distress, normal affect  HEENT: [place]/AT, face symmetric, conjunctiva clear, no scleral icterus, PERRLA, EOMI, nares patent without drainage, pharynx with mild erythema and area of exudate on L tonsil.  TMs normal b/l. Lungs: no accessory muscle use, CTAB, no wheezes or rales Cardiovascular: RRR, no m/r/g, no peripheral edema Neuro:  A&Ox3, CN II-XII intact, normal gait Skin:  Warm, no lesions/ rash   Wt Readings from Last 3 Encounters:  01/11/20 169  lb (76.7 kg)  12/17/19 167 lb (75.8 kg)  10/05/19 163 lb (73.9 kg)    Lab Results  Component Value Date   WBC 7.9 06/16/2018   HGB 13.5 06/16/2018   HCT 40.8 06/16/2018   PLT 367.0 06/16/2018   GLUCOSE 82 06/16/2018   CHOL 182 06/16/2018   TRIG 46.0 06/16/2018   HDL 53.10 06/16/2018   LDLCALC 120 (H) 06/16/2018   ALT 29 11/04/2017   AST 27 11/04/2017   NA 137 06/16/2018   K 4.8 06/16/2018   CL 103 06/16/2018   CREATININE 0.63 06/16/2018   BUN 10 06/16/2018   CO2 29 06/16/2018   TSH 2.61 06/16/2018   HGBA1C 5.4 06/16/2018    Assessment/Plan:  Strep throat -rapid test positive in clinic -will send out for Cx. -supportive care -Augmentin 500 mg twice daily - Plan: Throat culture  Concern about STD in female without diagnosis  -not sure pt understands HSV blood testing is not indicated for routine screening for HSV.  Discussed appropriate testing would be viral culture of an active lesion.  Patient without any lesions or symptoms. - Plan: Throat culture  F/u prn  Abbe Amsterdam, MD

## 2020-01-15 LAB — CULTURE, GROUP A STREP

## 2020-01-15 LAB — TIQ-MISC

## 2020-01-18 ENCOUNTER — Other Ambulatory Visit: Payer: Self-pay

## 2020-01-18 ENCOUNTER — Other Ambulatory Visit (INDEPENDENT_AMBULATORY_CARE_PROVIDER_SITE_OTHER): Payer: 59

## 2020-01-18 DIAGNOSIS — J020 Streptococcal pharyngitis: Secondary | ICD-10-CM

## 2020-01-18 LAB — POCT RAPID STREP A (OFFICE): Rapid Strep A Screen: POSITIVE — AB

## 2020-01-18 NOTE — Addendum Note (Signed)
Addended by: Carola Rhine on: 01/18/2020 12:45 PM   Modules accepted: Orders

## 2020-01-18 NOTE — Addendum Note (Signed)
Addended by: Carola Rhine on: 01/18/2020 12:43 PM   Modules accepted: Orders

## 2020-01-20 LAB — CULTURE, GROUP A STREP
MICRO NUMBER:: 10608249
SPECIMEN QUALITY:: ADEQUATE

## 2020-01-20 LAB — TIQ-NTM

## 2020-03-26 ENCOUNTER — Ambulatory Visit (INDEPENDENT_AMBULATORY_CARE_PROVIDER_SITE_OTHER): Payer: 59 | Admitting: Family Medicine

## 2020-03-26 ENCOUNTER — Other Ambulatory Visit: Payer: Self-pay

## 2020-03-26 ENCOUNTER — Other Ambulatory Visit (HOSPITAL_COMMUNITY)
Admission: RE | Admit: 2020-03-26 | Discharge: 2020-03-26 | Disposition: A | Discharge status: Home / Self Care | Payer: 59 | Source: Ambulatory Visit | Attending: Family Medicine | Admitting: Family Medicine

## 2020-03-26 ENCOUNTER — Encounter: Payer: Self-pay | Admitting: Family Medicine

## 2020-03-26 VITALS — BP 110/72 | HR 86 | Temp 98.2°F | Wt 169.0 lb

## 2020-03-26 DIAGNOSIS — Z3009 Encounter for other general counseling and advice on contraception: Secondary | ICD-10-CM

## 2020-03-26 DIAGNOSIS — N760 Acute vaginitis: Secondary | ICD-10-CM | POA: Diagnosis not present

## 2020-03-26 MED ORDER — NORELGESTROMIN-ETH ESTRADIOL 150-35 MCG/24HR TD PTWK: 1.0000 | MEDICATED_PATCH | TRANSDERMAL | 12 refills | Status: DC

## 2020-03-26 MED ORDER — FLUCONAZOLE 150 MG PO TABS: 150.0000 mg | ORAL_TABLET | Freq: Once | ORAL | 0 refills | Status: AC

## 2020-03-26 NOTE — Progress Notes (Signed)
Subjective:    Patient ID: Tracy Haynes, female    DOB: 1991-01-23, 29 y.o.   MRN: [id number]  No chief complaint on file.   HPI Patient was seen today for acute concern.  Pt endorses thick white vaginal d/c and irritation x 1 wk.  Pt also endorses odor.  Patient concern for BV.  Pt has not tried anything for her symptoms.  Pt denies n/v, suprapubic pain, dysuria, constipation, fever, chills, changes in soaps, lotions, detergents, or partners.  Pt also interested in birth control.  Was on Depo in the past for several years.  Pt interested in patch or pills.  LMP 03/02/2020.  Pt denies history of migraines with aura, h/o blood clots or clotting disorders, tobacco use.  Past Medical History:  Diagnosis Date  . Chlamydia infection   . Gonorrhea   . H/O varicella   . Hx: UTI (urinary tract infection)     Allergies  Allergen Reactions  . Blueberry [Vaccinium Angustifolium] Nausea And Vomiting    ROS General: Denies fever, chills, night sweats, changes in weight, changes in appetite HEENT: Denies headaches, ear pain, changes in vision, rhinorrhea, sore throat CV: Denies CP, palpitations, SOB, orthopnea Pulm: Denies SOB, cough, wheezing GI: Denies abdominal pain, nausea, vomiting, diarrhea, constipation GU: Denies dysuria, hematuria, frequency   + vaginal irritation/discharge Msk: Denies muscle cramps, joint pains Neuro: Denies weakness, numbness, tingling Skin: Denies rashes, bruising Psych: Denies depression, anxiety, hallucinations      Objective:    Blood pressure 110/72, pulse 86, temperature 98.2 F (36.8 C), temperature source Oral, weight 169 lb (76.7 kg), last menstrual period 03/02/2020, SpO2 98 %.  Gen. Pleasant, well-nourished, in no distress, normal affect   HEENT: [place]/AT, face symmetric, conjunctiva clear, no scleral icterus, PERRLA, EOMI, nares patent without drainage Lungs: no accessory muscle use, CTAB, no wheezes or rales Cardiovascular: RRR, no m/r/g, no  peripheral edema GU: Self swab obtained Neuro:  A&Ox3, CN II-XII intact, normal gait Skin:  Warm, no lesions/ rash   Wt Readings from Last 3 Encounters:  03/26/20 169 lb (76.7 kg)  01/11/20 169 lb (76.7 kg)  12/17/19 167 lb (75.8 kg)    Lab Results  Component Value Date   WBC 7.9 06/16/2018   HGB 13.5 06/16/2018   HCT 40.8 06/16/2018   PLT 367.0 06/16/2018   GLUCOSE 82 06/16/2018   CHOL 182 06/16/2018   TRIG 46.0 06/16/2018   HDL 53.10 06/16/2018   LDLCALC 120 (H) 06/16/2018   ALT 29 11/04/2017   AST 27 11/04/2017   NA 137 06/16/2018   K 4.8 06/16/2018   CL 103 06/16/2018   CREATININE 0.63 06/16/2018   BUN 10 06/16/2018   CO2 29 06/16/2018   TSH 2.61 06/16/2018   HGBA1C 5.4 06/16/2018    Assessment/Plan:  Acute vaginitis -Discussed possible causes including Candida, BV, other STI. -Aptima self swab obtained. -Given symptoms we will treat for yeast with Diflucan while awaiting results -If needed will send any additional medication pending results. - Plan: Cervicovaginal ancillary only, fluconazole (DIFLUCAN) 150 MG tablet  Birth control counseling -Discussed various birth control options.  Patient would like to try the patch. -Given handouts - Plan: norelgestromin-ethinyl estradiol (ORTHO EVRA) 150-35 MCG/24HR transdermal patch  F/u prn  Abbe Amsterdam, MD

## 2020-03-26 NOTE — Patient Instructions (Signed)
Vaginitis  Vaginitis is irritation and swelling (inflammation) of the vagina. It happens when normal bacteria and yeast in the vagina grow too much. There are many types of this condition. Treatment will depend on the type you have. Follow these instructions at home: Lifestyle  Keep your vagina area clean and dry. ? Avoid using soap. ? Rinse the area with water.  Do not do the following until your doctor says it is okay: ? Wash and clean out the vagina (douche). ? Use tampons. ? Have sex.  Wipe from front to back after going to the bathroom.  Let air reach your vagina. ? Wear cotton underwear. ? Do not wear:  Underwear while you sleep.  Tight pants.  Thong underwear.  Underwear or nylons without a cotton panel. ? Take off any wet clothing, such as bathing suits, as soon as possible.  Use gentle, non-scented products. Do not use things that can irritate the vagina, such as fabric softeners. Avoid the following products if they are scented: ? Feminine sprays. ? Detergents. ? Tampons. ? Feminine hygiene products. ? Soaps or bubble baths.  Practice safe sex and use condoms. General instructions  Take over-the-counter and prescription medicines only as told by your doctor.  If you were prescribed an antibiotic medicine, take or use it as told by your doctor. Do not stop taking or using the antibiotic even if you start to feel better.  Keep all follow-up visits as told by your doctor. This is important. Contact a doctor if:  You have pain in your belly.  You have a fever.  Your symptoms last for more than 2-3 days. Get help right away if:  You have a fever and your symptoms get worse all of a sudden. Summary  Vaginitis is irritation and swelling of the vagina. It can happen when the normal bacteria and yeast in the vagina grow too much. There are many types.  Treatment will depend on the type you have.  Do not douche, use tampons , or have sex until your health  care provider approves. When you can return to sex, practice safe sex and use condoms. This information is not intended to replace advice given to you by your health care provider. Make sure you discuss any questions you have with your health care provider. Document Revised: 07/01/2017 Document Reviewed: 08/10/2016 Elsevier Patient Education  2020 Elsevier Inc. Ethinyl Estradiol; Norelgestromin skin patches What is this medicine? ETHINYL ESTRADIOL;NORELGESTROMIN (ETH in il es tra DYE ole; nor el JES troe min) skin patch is used as a contraceptive (birth control method). This medicine combines two types of female hormones, an estrogen and a progestin. This patch is used to prevent ovulation and pregnancy. This medicine may be used for other purposes; ask your health care provider or pharmacist if you have questions. COMMON BRAND NAME(S): Ortho Christianne Borrow What should I tell my health care provider before I take this medicine? They need to know if you have or ever had any of these conditions:  abnormal vaginal bleeding  blood vessel disease or blood clots  breast, cervical, endometrial, ovarian, liver, or uterine cancer  diabetes  gallbladder disease  having surgery  heart disease or recent heart attack  high blood pressure  high cholesterol or triglycerides  history of irregular heartbeat or heart valve problems  kidney disease  liver disease  migraine headaches  protein C deficiency  protein S deficiency  recently had a baby, miscarriage, or abortion  stroke  systemic lupus erythematosus (  SLE)  tobacco smoker  an unusual or allergic reaction to estrogens, progestins, other medicines, foods, dyes, or preservatives  pregnant or trying to get pregnant  breast-feeding How should I use this medicine? This patch is applied to the skin. Follow the directions on the prescription label. Apply to clean, dry, healthy skin on the buttock, abdomen, upper outer arm or  upper torso, in a place where it will not be rubbed by tight clothing. Do not use lotions or other cosmetics on the site where the patch will go. Press the patch firmly in place for 10 seconds to ensure good contact with the skin. Change the patch every 7 days on the same day of the week for 3 weeks. You will then have a break from the patch for 1 week, after which you will apply a new patch. Do not use your medicine more often than directed. Contact your pediatrician regarding the use of this medicine in children. Special care may be needed. This medicine has been used in female children who have started having menstrual periods. A patient package insert for the product will be given with each prescription and refill. Read this sheet carefully each time. The sheet may change frequently. Overdosage: If you think you have taken too much of this medicine contact a poison control center or emergency room at once. NOTE: This medicine is only for you. Do not share this medicine with others. What if I miss a dose? You will need to replace your patch once a week as directed. If your patch is lost or falls off, contact your health care professional for advice. You may need to use another form of birth control if your patch has been off for more than 1 day. What may interact with this medicine? Do not take this medicine with the following medications:  dasabuvir; ombitasvir; paritaprevir; ritonavir  ombitasvir; paritaprevir; ritonavir This medicine may also interact with the following medications:  acetaminophen  antibiotics or medicines for infections, especially rifampin, rifabutin, rifapentine, and possibly penicillins or tetracyclines  aprepitant or fosaprepitant  armodafinil  ascorbic acid (vitamin C)  barbiturate medicines, such as phenobarbital or primidone  bosentan  certain antiviral medicines for hepatitis, HIV or AIDS  certain medicines for cancer treatment  certain medicines for  seizures like carbamazepine, clobazam, felbamate, lamotrigine, oxcarbazepine, phenytoin, rufinamide, topiramate  certain medicines for treating high cholesterol  cyclosporine  dantrolene  elagolix  flibanserin  grapefruit juice  lesinurad  medicines for diabetes  medicines to treat fungal infections, such as griseofulvin, miconazole, fluconazole, ketoconazole, itraconazole, posaconazole or voriconazole  mifepristone  mitotane  modafinil  morphine  mycophenolate  St. John's wort  tamoxifen  temazepam  theophylline or aminophylline  thyroid hormones  tizanidine  tranexamic acid  ulipristal  warfarin This list may not describe all possible interactions. Give your health care provider a list of all the medicines, herbs, non-prescription drugs, or dietary supplements you use. Also tell them if you smoke, drink alcohol, or use illegal drugs. Some items may interact with your medicine. What should I watch for while using this medicine? Visit your doctor or health care professional for regular checks on your progress. You will need a regular breast and pelvic exam and Pap smear while on this medicine. Use an additional method of contraception during the first cycle that you use this patch. If you have any reason to think you are pregnant, stop using this medicine right away and contact your doctor or health care professional. If you are  using this medicine for hormone related problems, it may take several cycles of use to see improvement in your condition. Smoking increases the risk of getting a blood clot or having a stroke while you are using hormonal birth control, especially if you are more than 27 years old. You are strongly advised not to smoke. This medicine can make your body retain fluid, making your fingers, hands, or ankles swell. Your blood pressure can go up. Contact your doctor or health care professional if you feel you are retaining fluid. This medicine  can make you more sensitive to the sun. Keep out of the sun. If you cannot avoid being in the sun, wear protective clothing and use sunscreen. Do not use sun lamps or tanning beds/booths. If you wear contact lenses and notice visual changes, or if the lenses begin to feel uncomfortable, consult your eye care specialist. In some women, tenderness, swelling, or minor bleeding of the gums may occur. Notify your dentist if this happens. Brushing and flossing your teeth regularly may help limit this. See your dentist regularly and inform your dentist of the medicines you are taking. If you are going to have elective surgery or a MRI, you may need to stop using this medicine before the surgery or MRI. Consult your health care professional for advice. This medicine does not protect you against HIV infection (AIDS) or any other sexually transmitted diseases. What side effects may I notice from receiving this medicine? Side effects that you should report to your doctor or health care professional as soon as possible:  allergic reactions such as skin rash or itching, hives, swelling of the lips, mouth, tongue, or throat  breast tissue changes or discharge  dark patches of skin on your forehead, cheeks, upper lip, and chin  depression  high blood pressure  migraines or severe, sudden headaches  missed menstrual periods  signs and symptoms of a blood clot such as breathing problems; changes in vision; chest pain; severe, sudden headache; pain, swelling, warmth in the leg; trouble speaking; sudden numbness or weakness of the face, arm or leg  skin reactions at the patch site such as blistering, bleeding, itching, rash, or swelling  stomach pain  yellowing of the eyes or skin Side effects that usually do not require medical attention (report these to your doctor or health care professional if they continue or are bothersome):  breast tenderness  irregular vaginal bleeding or spotting, particularly  during the first 3 months of use  headache  nausea  painful menstrual periods  skin redness or mild irritation at site where applied  weight gain (slight) This list may not describe all possible side effects. Call your doctor for medical advice about side effects. You may report side effects to FDA at 1-800-FDA-1088. Where should I keep my medicine? Keep out of the reach of children. Store at room temperature between 15 and 30 degrees C (59 and 86 degrees F). Keep the patch in its pouch until time of use. Throw away any unused medicine after the expiration date. Dispose of used patches properly. Since a used patch may still contain active hormones, fold the patch in half so that it sticks to itself prior to disposal. Throw away in a place where children or pets cannot reach. NOTE: This sheet is a summary. It may not cover all possible information. If you have questions about this medicine, talk to your doctor, pharmacist, or health care provider.  2020 Elsevier/Gold Standard (2018-10-24 11:56:29)

## 2020-03-27 LAB — CERVICOVAGINAL ANCILLARY ONLY
Bacterial Vaginitis (gardnerella): NEGATIVE
Candida Glabrata: NEGATIVE
Candida Vaginitis: POSITIVE — AB
Chlamydia: NEGATIVE
Comment: NEGATIVE
Comment: NEGATIVE
Comment: NEGATIVE
Comment: NEGATIVE
Comment: NEGATIVE
Comment: NORMAL
Neisseria Gonorrhea: NEGATIVE
Trichomonas: NEGATIVE

## 2020-09-18 ENCOUNTER — Other Ambulatory Visit (HOSPITAL_COMMUNITY)
Admission: RE | Admit: 2020-09-18 | Discharge: 2020-09-18 | Disposition: A | Discharge status: Home / Self Care | Payer: 59 | Source: Ambulatory Visit | Attending: Family Medicine | Admitting: Family Medicine

## 2020-09-18 ENCOUNTER — Other Ambulatory Visit: Payer: Self-pay

## 2020-09-18 ENCOUNTER — Encounter: Payer: Self-pay | Admitting: Family Medicine

## 2020-09-18 ENCOUNTER — Ambulatory Visit (INDEPENDENT_AMBULATORY_CARE_PROVIDER_SITE_OTHER): Payer: 59 | Admitting: Family Medicine

## 2020-09-18 VITALS — BP 121/84 | HR 79 | Temp 98.1°F | Ht 61.0 in | Wt 170.0 lb

## 2020-09-18 DIAGNOSIS — L304 Erythema intertrigo: Secondary | ICD-10-CM | POA: Diagnosis not present

## 2020-09-18 DIAGNOSIS — N760 Acute vaginitis: Secondary | ICD-10-CM

## 2020-09-18 MED ORDER — FLUCONAZOLE 150 MG PO TABS: 150.0000 mg | ORAL_TABLET | Freq: Once | ORAL | 0 refills | Status: DC

## 2020-09-18 MED ORDER — NYSTATIN 100000 UNIT/GM EX CREA: 1.0000 "application " | TOPICAL_CREAM | Freq: Two times a day (BID) | CUTANEOUS | 0 refills | Status: DC

## 2020-09-18 MED ORDER — FLUCONAZOLE 150 MG PO TABS
150.0000 mg | ORAL_TABLET | Freq: Once | ORAL | 0 refills | Status: AC
Start: 2020-09-18 — End: 2020-09-18

## 2020-09-18 NOTE — Progress Notes (Signed)
Subjective:    Patient ID: Tracy Haynes, female    DOB: 08/31/1990, 30 y.o.   MRN: [id number]  Chief Complaint  Patient presents with  . Follow-up  . Vaginal Discharge    Vaginal irritation with discharge    HPI Patient was seen today for acute concern.  Pt with vaginal irritation x 6 days.  Endorses edema, brownish d/c, pain during sexual intercourse.  OTC monistat cream caused burning.  Hydrocortisone helped some.  Denies suprapubic pain, nausea, vomiting, back pain, dysuria.  Past Medical History:  Diagnosis Date  . Chlamydia infection   . Gonorrhea   . H/O varicella   . Hx: UTI (urinary tract infection)     Allergies  Allergen Reactions  . Blueberry [Vaccinium Angustifolium] Nausea And Vomiting    ROS General: Denies fever, chills, night sweats, changes in weight, changes in appetite HEENT: Denies headaches, ear pain, changes in vision, rhinorrhea, sore throat CV: Denies CP, palpitations, SOB, orthopnea Pulm: Denies SOB, cough, wheezing GI: Denies abdominal pain, nausea, vomiting, diarrhea, constipation GU: Denies dysuria, hematuria, frequency  +vaginal discharge, irritation Msk: Denies muscle cramps, joint pains Neuro: Denies weakness, numbness, tingling Skin: Denies rashes, bruising Psych: Denies depression, anxiety, hallucinations      Objective:    Blood pressure 121/84, pulse 79, temperature 98.1 F (36.7 C), temperature source Oral, height 5\' 1"  (1.549 m), weight 170 lb (77.1 kg), SpO2 99 %.   Gen. Pleasant, well-nourished, in no distress, normal affect   HEENT: [place]/AT, face symmetric, conjunctiva clear, no scleral icterus, PERRLA, EOMI, nares patent without drainage Lungs: no accessory muscle use Cardiovascular: RRR, no m/r/g, no peripheral edema GU: Normal external female genitalia irritation, erythema, edema of skin of labia majora, labia minora, perineum.  Normal urethral meatus and anus.  No vesicles noted.  Scant amount of white discharge noted  at the vaginal introitus.  Vaginal swab collected. Neuro:  A&Ox3, CN II-XII intact, normal gait Skin:  Warm, no lesions/ rash   Wt Readings from Last 3 Encounters:  09/18/20 170 lb (77.1 kg)  03/26/20 169 lb (76.7 kg)  01/11/20 169 lb (76.7 kg)    Lab Results  Component Value Date   WBC 7.9 06/16/2018   HGB 13.5 06/16/2018   HCT 40.8 06/16/2018   PLT 367.0 06/16/2018   GLUCOSE 82 06/16/2018   CHOL 182 06/16/2018   TRIG 46.0 06/16/2018   HDL 53.10 06/16/2018   LDLCALC 120 (H) 06/16/2018   ALT 29 11/04/2017   AST 27 11/04/2017   NA 137 06/16/2018   K 4.8 06/16/2018   CL 103 06/16/2018   CREATININE 0.63 06/16/2018   BUN 10 06/16/2018   CO2 29 06/16/2018   TSH 2.61 06/16/2018   HGBA1C 5.4 06/16/2018    Assessment/Plan:  Acute vaginitis  -Abdomen swab collected -We will start Diflucan 100 mg p.o. -Given Rx for nystatin cream -Given precautions -Given handout - Plan: Cervicovaginal ancillary only, nystatin cream (MYCOSTATIN)  Intertrigo -Given handout - Plan: fluconazole (DIFLUCAN) 150 MG tablet  F/u prn  06/18/2018, MD

## 2020-09-18 NOTE — Patient Instructions (Signed)
Intertrigo Intertrigo is skin irritation (inflammation) that happens in warm, moist areas of the body. The irritation can cause a rash and make skin raw and itchy. The rash is usually pink or red. It happens mostly between folds of skin or where skin rubs together, such as:  Between the toes.  In the armpits.  In the groin area.  Under the belly.  Under the breasts.  Around the butt area. This condition is not passed from person to person (is not contagious). What are the causes?  Heat, moisture, rubbing, and not enough air movement.  The condition can be made worse by: ? Sweat. ? Bacteria. ? A fungus, such as yeast. What increases the risk?  Moisture in your skin folds.  You are more likely to develop this condition if you: ? Have diabetes. ? Are overweight. ? Are not able to move around. ? Live in a warm and moist climate. ? Wear splints, braces, or other medical devices. ? Are not able to control your pee (urine) or poop (stool). What are the signs or symptoms?  A pink or red skin rash in the skin fold or near the skin fold.  Raw or scaly skin.  Itching.  A burning feeling.  Bleeding.  Leaking fluid.  A bad smell. How is this treated?  Cleaning and drying your skin.  Taking an antibiotic medicine or using an antibiotic skin cream for a bacterial infection.  Using an antifungal cream on your skin or taking pills for an infection that was caused by a fungus, such as yeast.  Using a steroid ointment to stop the itching and irritation.  Separating the skin fold with a clean cotton cloth to absorb moisture and allow air to flow into the area. Follow these instructions at home:  Keep the affected area clean and dry.  Do not scratch your skin.  Stay cool as much as you can. Use an air conditioner or a fan, if you have one.  Apply over-the-counter and prescription medicines only as told by your doctor.  If you were prescribed an antibiotic medicine,  use it as told by your doctor. Do not stop using the antibiotic even if your condition starts to get better.  Keep all follow-up visits as told by your doctor. This is important. How is this prevented?  Stay at a healthy weight.  Take care of your feet. This is very important if you have diabetes. You should: ? Wear shoes that fit well. ? Keep your feet dry. ? Wear clean cotton or wool socks.  Protect the skin in your groin and butt area as told by your doctor. To do this: ? Follow a regular cleaning routine. ? Use creams, powders, or ointments that protect your skin. ? Change protection pads often.  Do not wear tight clothes. Wear clothes that: ? Are loose. ? Take moisture away from your body. ? Are made of cotton.  Wear a bra that gives good support, if needed.  Shower and dry yourself well after being active. Use a hair dryer on a cool setting to dry between skin folds.  Keep your blood sugar under control if you have diabetes.   Contact a doctor if:  Your symptoms do not get better with treatment.  Your symptoms get worse or they spread.  You notice more redness and warmth.  You have a fever. Summary  Intertrigo is skin irritation that occurs when folds of skin rub together.  This condition is caused by heat,   moisture, and rubbing.  This condition may be treated by cleaning and drying your skin and with medicines.  Apply over-the-counter and prescription medicines only as told by your doctor.  Keep all follow-up visits as told by your doctor. This is important. This information is not intended to replace advice given to you by your health care provider. Make sure you discuss any questions you have with your health care provider. Document Revised: 04/27/2018 Document Reviewed: 04/27/2018 Elsevier Patient Education  2021 Elsevier Inc.  Vaginitis  Vaginitis is irritation and swelling of the vagina. Treatment will depend on the cause. What are the causes? It  can be caused by:  Bacteria.  Yeast.  A parasite.  A virus.  Low hormone levels.  Bubble baths, scented tampons, and feminine sprays. Other things can change the balance of the yeast and bacteria that live in the vagina. These include:  Antibiotic medicines.  Not being clean enough.  Some birth control methods.  Sex.  Infection.  Diabetes.  A weakened body defense system (immune system). What increases the risk?  Smoking or being around someone who smokes.  Using washes (douches), scented tampons, or scented pads.  Wearing tight pants or thong underwear.  Using birth control pills or an IUD.  Having sex without a condom or having a lot of partners.  Having an STI.  Using a certain product to kill sperm (nonoxynol-9).  Eating foods that are high in sugar.  Having diabetes.  Having low levels of a female hormone.  Having a weakened body defense system.  Being pregnant or breastfeeding. What are the signs or symptoms?  Fluid coming from the vagina that is not normal.  A bad smell.  Itching, pain, or swelling.  Pain with sex.  Pain or burning when you pee (urinate). Sometimes there are no symptoms. How is this treated? Treatment may include:  Antibiotic creams or pills.  Antifungal medicines.  Medicines to ease symptoms if you have a virus. Your sex partner should also be treated.  Estrogen medicines.  Avoiding scented soaps, sprays, or douches.  Stopping use of products that caused irritation and then using a cream to treat symptoms. Follow these instructions at home: Lifestyle  Keep the area around your vagina clean and dry. ? Avoid using soap. ? Rinse the area with water.  Until your doctor says it is okay: ? Do not use washes for the vagina. ? Do not use tampons. ? Do not have sex.  Wipe from front to back after going to the bathroom.  When your doctor says it is okay, practice safe sex and use condoms. General  instructions  Take over-the-counter and prescription medicines only as told by your doctor.  If you were prescribed an antibiotic medicine, take or use it as told by your doctor. Do not stop taking or using it even if you start to feel better.  Keep all follow-up visits. How is this prevented?  Do not use things that can irritate the vagina, such as fabric softeners. Avoid these products if they are scented: ? Sprays. ? Detergents. ? Tampons. ? Products for cleaning the vagina. ? Soaps or bubble baths.  Let air reach your vagina. To do this: ? Wear cotton underwear. ? Do not wear:  Underwear while you sleep.  Tight pants.  Thong underwear.  Underwear or nylons without a cotton panel. ? Take off any wet clothing, such as bathing suits, as soon as you can. ? Practice safe sex and use condoms. Contact  a doctor if:  You have pain in your belly or in the area between your hips.  You have a fever or chills.  Your symptoms last for more than 2-3 days. Get help right away if:  You have a fever and your symptoms get worse all of a sudden. Summary  Vaginitis is irritation and swelling of the vagina.  Treatment will depend on the cause of the condition.  Do not use washes or tampons or have sex until your doctor says it is okay. This information is not intended to replace advice given to you by your health care provider. Make sure you discuss any questions you have with your health care provider. Document Revised: 01/17/2020 Document Reviewed: 01/17/2020 Elsevier Patient Education  2021 [employer].

## 2020-09-19 LAB — CERVICOVAGINAL ANCILLARY ONLY
Bacterial Vaginitis (gardnerella): NEGATIVE
Candida Glabrata: NEGATIVE
Candida Vaginitis: POSITIVE — AB
Chlamydia: NEGATIVE
Comment: NEGATIVE
Comment: NEGATIVE
Comment: NEGATIVE
Comment: NEGATIVE
Comment: NEGATIVE
Comment: NORMAL
Neisseria Gonorrhea: NEGATIVE
Trichomonas: NEGATIVE

## 2020-09-22 NOTE — Telephone Encounter (Signed)
Encounter opened in error

## 2021-01-08 ENCOUNTER — Other Ambulatory Visit: Payer: Self-pay

## 2021-01-09 ENCOUNTER — Ambulatory Visit: Payer: 59 | Admitting: Family Medicine

## 2021-01-14 ENCOUNTER — Ambulatory Visit: Payer: 59 | Admitting: Family Medicine

## 2021-01-28 ENCOUNTER — Encounter: Payer: Self-pay | Admitting: Family Medicine

## 2021-02-26 ENCOUNTER — Other Ambulatory Visit (HOSPITAL_COMMUNITY)
Admission: RE | Admit: 2021-02-26 | Discharge: 2021-02-26 | Disposition: A | Discharge status: Home / Self Care | Payer: 59 | Source: Ambulatory Visit | Attending: Family Medicine | Admitting: Family Medicine

## 2021-02-26 ENCOUNTER — Encounter: Payer: Self-pay | Admitting: Family Medicine

## 2021-02-26 ENCOUNTER — Other Ambulatory Visit: Payer: Self-pay

## 2021-02-26 ENCOUNTER — Ambulatory Visit (INDEPENDENT_AMBULATORY_CARE_PROVIDER_SITE_OTHER): Payer: 59 | Admitting: Family Medicine

## 2021-02-26 VITALS — BP 138/98 | HR 64 | Temp 98.6°F | Wt 177.6 lb

## 2021-02-26 DIAGNOSIS — Z113 Encounter for screening for infections with a predominantly sexual mode of transmission: Secondary | ICD-10-CM | POA: Insufficient documentation

## 2021-02-26 DIAGNOSIS — B370 Candidal stomatitis: Secondary | ICD-10-CM | POA: Diagnosis not present

## 2021-02-26 MED ORDER — MAGIC MOUTHWASH: 5.0000 mL | Freq: Four times a day (QID) | ORAL | 0 refills | Status: DC | PRN

## 2021-02-26 MED ORDER — MAGIC MOUTHWASH: ORAL | 0 refills | Status: DC

## 2021-02-26 NOTE — Progress Notes (Signed)
Subjective:    Patient ID: Temisha Murley, female    DOB: [birth year], 30 y.o.   MRN: [id number]  Chief Complaint  Patient presents with   tongue    Red spots on tongue, with irritation, noticed 2 days ago    HPI Patient was seen today for acute concern.  Patient endorses irritation of tongue with red spots x2 days.  Patient noticed symptoms after oral sex.  Used a flavored lubricant.  Tried gentian violet for symptoms.  Denies vaginal discharge/irritation but inquires about STI screening.  Past Medical History:  Diagnosis Date   Chlamydia infection    Gonorrhea    H/O varicella    Hx: UTI (urinary tract infection)     Allergies  Allergen Reactions   Blueberry [Vaccinium Angustifolium] Nausea And Vomiting    ROS General: Denies fever, chills, night sweats, changes in weight, changes in appetite HEENT: Denies headaches, ear pain, changes in vision, rhinorrhea, sore throat  + irritation of the tongue and mouth CV: Denies CP, palpitations, SOB, orthopnea Pulm: Denies SOB, cough, wheezing GI: Denies abdominal pain, nausea, vomiting, diarrhea, constipation GU: Denies dysuria, hematuria, frequency, vaginal discharge Msk: Denies muscle cramps, joint pains Neuro: Denies weakness, numbness, tingling Skin: Denies rashes, bruising Psych: Denies depression, anxiety, hallucinations     Objective:    Blood pressure (!) 138/98, pulse 64, temperature 98.6 F (37 C), temperature source Oral, weight 177 lb 9.6 oz (80.6 kg).  Gen. Pleasant, well-nourished, in no distress, normal affect   HEENT: /AT, face symmetric, conjunctiva clear, no scleral icterus, PERRLA, EOMI, nares patent without drainage, irritation of oral mucosa with ulcer on roof of mouth near incisors.  Coating visible on tongue with areas of irritation. mild protected time.  Pharynx without erythema or exudate. GU: Aptima self swab done Lungs: no accessory muscle use Cardiovascular: RRR, no peripheral edema Neuro:   A&Ox3, CN II-XII intact, normal gait Skin:  Warm, no lesions/ rash   Wt Readings from Last 3 Encounters:  02/26/21 177 lb 9.6 oz (80.6 kg)  09/18/20 170 lb (77.1 kg)  03/26/20 169 lb (76.7 kg)    Lab Results  Component Value Date   WBC 7.9 06/16/2018   HGB 13.5 06/16/2018   HCT 40.8 06/16/2018   PLT 367.0 06/16/2018   GLUCOSE 82 06/16/2018   CHOL 182 06/16/2018   TRIG 46.0 06/16/2018   HDL 53.10 06/16/2018   LDLCALC 120 (H) 06/16/2018   ALT 29 11/04/2017   AST 27 11/04/2017   NA 137 06/16/2018   K 4.8 06/16/2018   CL 103 06/16/2018   CREATININE 0.63 06/16/2018   BUN 10 06/16/2018   CO2 29 06/16/2018   TSH 2.61 06/16/2018   HGBA1C 5.4 06/16/2018    Assessment/Plan:  Thrush  -Supportive care -Swish and spit Magic mouthwash (equal parts viscous lidocaine, nystatin oral suspension, diphenhydramine 12.5mg /5 mL, dexamethasone 0.5 mg per 5 mL) 4 times daily as needed.  Rx printed - Plan: magic mouthwash SOLN  Routine screening for STI (sexually transmitted infection)  -safe sex practices - Plan: Cervicovaginal ancillary only, RPR, HIV Antibody (routine testing w rflx), HIV Antibody (routine testing w rflx)  F/u as needed  Abbe Amsterdam, MD

## 2021-02-27 ENCOUNTER — Other Ambulatory Visit: Payer: Self-pay | Admitting: Family Medicine

## 2021-02-27 DIAGNOSIS — B3731 Acute candidiasis of vulva and vagina: Secondary | ICD-10-CM

## 2021-02-27 DIAGNOSIS — B373 Candidiasis of vulva and vagina: Secondary | ICD-10-CM

## 2021-02-27 LAB — CERVICOVAGINAL ANCILLARY ONLY
Bacterial Vaginitis (gardnerella): NEGATIVE
Candida Glabrata: NEGATIVE
Candida Vaginitis: POSITIVE — AB
Chlamydia: NEGATIVE
Comment: NEGATIVE
Comment: NEGATIVE
Comment: NEGATIVE
Comment: NEGATIVE
Comment: NEGATIVE
Comment: NORMAL
Neisseria Gonorrhea: NEGATIVE
Trichomonas: NEGATIVE

## 2021-02-27 LAB — HIV ANTIBODY (ROUTINE TESTING W REFLEX): HIV 1&2 Ab, 4th Generation: NONREACTIVE

## 2021-02-27 LAB — RPR: RPR Ser Ql: NONREACTIVE

## 2021-02-27 MED ORDER — FLUCONAZOLE 150 MG PO TABS
150.0000 mg | ORAL_TABLET | Freq: Once | ORAL | 0 refills | Status: AC
Start: 2021-02-27 — End: 2021-02-27

## 2021-03-28 ENCOUNTER — Other Ambulatory Visit: Payer: Self-pay

## 2021-03-28 ENCOUNTER — Encounter (HOSPITAL_COMMUNITY): Payer: Self-pay

## 2021-03-28 ENCOUNTER — Emergency Department (HOSPITAL_COMMUNITY)
Admission: EM | Admit: 2021-03-28 | Discharge: 2021-03-28 | Disposition: A | Discharge status: Home / Self Care | Payer: 59 | Attending: Emergency Medicine | Admitting: Emergency Medicine

## 2021-03-28 ENCOUNTER — Emergency Department (HOSPITAL_COMMUNITY): Payer: 59

## 2021-03-28 DIAGNOSIS — R110 Nausea: Secondary | ICD-10-CM | POA: Diagnosis not present

## 2021-03-28 DIAGNOSIS — R102 Pelvic and perineal pain: Secondary | ICD-10-CM | POA: Insufficient documentation

## 2021-03-28 DIAGNOSIS — R109 Unspecified abdominal pain: Secondary | ICD-10-CM | POA: Diagnosis present

## 2021-03-28 LAB — COMPREHENSIVE METABOLIC PANEL
ALT: 17 U/L (ref 0–44)
AST: 16 U/L (ref 15–41)
Albumin: 4.1 g/dL (ref 3.5–5.0)
Alkaline Phosphatase: 52 U/L (ref 38–126)
Anion gap: 8 (ref 5–15)
BUN: 9 mg/dL (ref 6–20)
CO2: 24 mmol/L (ref 22–32)
Calcium: 9.1 mg/dL (ref 8.9–10.3)
Chloride: 103 mmol/L (ref 98–111)
Creatinine, Ser: 0.64 mg/dL (ref 0.44–1.00)
GFR, Estimated: >60 mL/min (ref >60)
Glucose, Bld: 90 mg/dL (ref 70–99)
Potassium: 3.8 mmol/L (ref 3.5–5.1)
Sodium: 135 mmol/L (ref 135–145)
Total Bilirubin: 0.6 mg/dL (ref 0.3–1.2)
Total Protein: 7.9 g/dL (ref 6.5–8.1)

## 2021-03-28 LAB — URINALYSIS, ROUTINE W REFLEX MICROSCOPIC
Bilirubin Urine: NEGATIVE
Glucose, UA: NEGATIVE mg/dL
Hgb urine dipstick: NEGATIVE
Ketones, ur: NEGATIVE mg/dL
Leukocytes,Ua: NEGATIVE
Nitrite: NEGATIVE
Protein, ur: NEGATIVE mg/dL
Specific Gravity, Urine: 1.017 (ref 1.005–1.030)
pH: 7 (ref 5.0–8.0)

## 2021-03-28 LAB — CBC
HCT: 39.9 % (ref 36.0–46.0)
Hemoglobin: 13.3 g/dL (ref 12.0–15.0)
MCH: 28.1 pg (ref 26.0–34.0)
MCHC: 33.3 g/dL (ref 30.0–36.0)
MCV: 84.2 fL (ref 80.0–100.0)
Platelets: 383 10*3/uL (ref 150–400)
RBC: 4.74 MIL/uL (ref 3.87–5.11)
RDW: 12.1 % (ref 11.5–15.5)
WBC: 8.8 10*3/uL (ref 4.0–10.5)
nRBC: 0 % (ref 0.0–0.2)

## 2021-03-28 LAB — I-STAT BETA HCG BLOOD, ED (MC, WL, AP ONLY): I-stat hCG, quantitative: <5 m[IU]/mL (ref <5)

## 2021-03-28 LAB — RAPID HIV SCREEN (HIV 1/2 AB+AG)
HIV 1/2 Antibodies: NONREACTIVE
HIV-1 P24 Antigen - HIV24: NONREACTIVE

## 2021-03-28 LAB — LIPASE, BLOOD: Lipase: 31 U/L (ref 11–51)

## 2021-03-28 MED ORDER — LIDOCAINE HCL (PF) 1 % IJ SOLN: 1.0000 mL | Freq: Once | INTRAMUSCULAR | Status: DC

## 2021-03-28 MED ORDER — CEFTRIAXONE SODIUM 1 G IJ SOLR: 500.0000 mg | Freq: Once | INTRAMUSCULAR | Status: DC

## 2021-03-28 MED ORDER — DOXYCYCLINE HYCLATE 100 MG PO TABS: 100.0000 mg | ORAL_TABLET | Freq: Two times a day (BID) | ORAL | 0 refills | Status: DC

## 2021-03-28 MED ORDER — SODIUM CHLORIDE 0.9 % IV SOLN
1.0000 g | Freq: Once | INTRAVENOUS | Status: AC
  Administered 2021-03-28: 1 g via INTRAVENOUS
  Filled 2021-03-28: qty 10

## 2021-03-28 NOTE — ED Notes (Signed)
MD with tech in room for pelvic exam

## 2021-03-28 NOTE — ED Provider Notes (Signed)
[facility]-EMERGENCY DEPT Provider Note   CSN: [id number] Arrival date & time: 03/28/21  4098     History Chief Complaint  Patient presents with   Abdominal Pain    Tracy Haynes is a 30 y.o. female with a past medical history of previous gonorrhea, chlamydia infection who presents with "ovarian" pain secondary to sexual intercourse for the last 1 day.  She reports she has intermittent 10 out of 10 ovarian crampy pain occasionally with nausea, without vaginal bleeding, discharge after having intercourse only some of the time.  This issue has been recurrent.  She reports that her menstrual periods have been regular, no additional pain or bleeding no change in timing of cycle.  She reports at this time the pain is about a 3/10, the pain often will last for about 24 hours before resolving.  She has not taken anything for the pain.  She has not had an ultrasound or other formal gynecologic evaluation for this issue.  She denies dysuria, urinary frequency.   Abdominal Pain Associated symptoms: nausea   Associated symptoms: no vaginal bleeding and no vaginal discharge       Past Medical History:  Diagnosis Date   Chlamydia infection    Gonorrhea    H/O varicella    Hx: UTI (urinary tract infection)     Patient Active Problem List   Diagnosis Date Noted   Goiter 06/16/2018   Headache 10/18/2017   Vaginal laceration 02/09/2012   Normal pregnancy, first 11/17/2011   Gonorrhea 11/11/2011   Chlamydia infection, current pregnancy 11/11/2011    Past Surgical History:  Procedure Laterality Date   WISDOM TOOTH EXTRACTION       OB History     Gravida  1   Para  1   Term  1   Preterm  0   AB  0   Living  1      SAB  0   IAB  0   Ectopic  0   Multiple  0   Live Births  1           Family History  Problem Relation Age of Onset   Hypertension Mother    Arthritis Mother    Thyroid disease Mother    Asthma Brother     Thalassemia Brother    Alcohol abuse Father    Diabetes Maternal Aunt    Anesthesia problems Neg Hx     Social History   Tobacco Use   Smoking status: Never   Smokeless tobacco: Never  Vaping Use   Vaping Use: Never used  Substance Use Topics   Alcohol use: No    Comment: quit 2018   Drug use: No    Home Medications Prior to Admission medications   Medication Sig Start Date End Date Taking? Authorizing Provider  doxycycline (VIBRA-TABS) 100 MG tablet Take 1 tablet (100 mg total) by mouth 2 (two) times daily. 03/28/21  Yes Chevelle Durr H, PA-C  magic mouthwash SOLN Swish and spit 5 mL 4 times daily as needed. 02/26/21   Deeann Saint, MD  nystatin cream (MYCOSTATIN) Apply 1 application topically 2 (two) times daily. 09/18/20   Deeann Saint, MD    Allergies    Blueberry [vaccinium angustifolium]  Review of Systems   Review of Systems  Gastrointestinal:  Positive for abdominal pain and nausea.  Genitourinary:  Positive for pelvic pain. Negative for vaginal bleeding, vaginal discharge and vaginal pain.  All other systems reviewed  and are negative.  Physical Exam Updated Vital Signs BP 119/77 (BP Location: Left Arm)   Pulse 72   Temp (!) 97.4 F (36.3 C) (Oral)   Resp 16   SpO2 97%   Physical Exam Vitals and nursing note reviewed.  Constitutional:      General: She is not in acute distress.    Appearance: She is well-developed.  HENT:     Head: Normocephalic and atraumatic.  Eyes:     Conjunctiva/sclera: Conjunctivae normal.  Cardiovascular:     Rate and Rhythm: Normal rate and regular rhythm.     Heart sounds: No murmur heard. Pulmonary:     Effort: Pulmonary effort is normal. No respiratory distress.     Breath sounds: Normal breath sounds.  Abdominal:     Palpations: Abdomen is soft.     Tenderness: There is no abdominal tenderness.     Comments: Bilateral suprapubic tenderness to deep palpation per patient report.  No rebound, rigidity,  guarding at this time.  Genitourinary:    Vagina: Normal. No signs of injury.     Comments: Patient with a moderate amount of white discharge from her cervix, cervix is multiparous, with no evidence of redness, irritation.  She does have tenderness to palpation of the cervix during bimanual exam, without chandelier sign. Musculoskeletal:     Cervical back: Neck supple.  Skin:    General: Skin is warm and dry.  Neurological:     Mental Status: She is alert.    ED Results / Procedures / Treatments   Labs (all labs ordered are listed, but only abnormal results are displayed) Labs Reviewed  LIPASE, BLOOD  COMPREHENSIVE METABOLIC PANEL  CBC  URINALYSIS, ROUTINE W REFLEX MICROSCOPIC  RAPID HIV SCREEN (HIV 1/2 AB+AG)  I-STAT BETA HCG BLOOD, ED ([facility], [facility], [facility] ONLY)  GC/CHLAMYDIA PROBE AMP ([place]) NOT AT [facility]    EKG None  Radiology US PELVIC COMPLETE W TRANSVAGINAL AND TORSION R/O  Result Date: 03/28/2021 CLINICAL DATA:  Pelvic pain since last night. EXAM: TRANSABDOMINAL AND TRANSVAGINAL ULTRASOUND OF PELVIS DOPPLER ULTRASOUND OF OVARIES TECHNIQUE: Both transabdominal and transvaginal ultrasound examinations of the pelvis were performed. Transabdominal technique was performed for global imaging of the pelvis including uterus, ovaries, adnexal regions, and pelvic cul-de-sac. It was necessary to proceed with endovaginal exam following the transabdominal exam to visualize the adnexal structures to an adequate degree. Color and duplex Doppler ultrasound was utilized to evaluate blood flow to the ovaries. COMPARISON:  None. FINDINGS: Uterus Measurements: 7.9 x 4.6 x 5.0 cm = volume: 94 mL. No fibroids or other mass visualized. Endometrium Thickness: 8 mm.  No focal abnormality visualized. Right ovary Measurements: 3.4 x 2.3 x 2.8 cm = volume: 11.1 mL. Normal appearance/no adnexal mass. Left ovary Measurements: 4.2 x 3.1 x 3.8 cm = volume: 27 mL. Small benign hemorrhagic cyst within the LEFT  ovary, measuring 3 x 2.5 cm. Pulsed Doppler evaluation of both ovaries demonstrates normal low-resistance arterial and venous waveforms. Other findings No abnormal free fluid. IMPRESSION: 1. Small benign hemorrhagic cyst within the LEFT ovary, measuring 3 cm. No mass or free fluid within the adjacent LEFT adnexal region. No followup imaging recommended. Note: This recommendation does not apply to premenarchal patients or to those with increased risk (genetic, family history, elevated tumor markers or other high-risk factors) of ovarian cancer. Reference: Radiology 2019 Nov; 293(2):359-371. 2. Otherwise normal pelvic ultrasound. No evidence of ovarian torsion bilaterally. Electronically Signed   By: Anne Ng.D.  On: 03/28/2021 11:13    Procedures Procedures   Medications Ordered in ED Medications  cefTRIAXone (ROCEPHIN) 1 g in sodium chloride 0.9 % 100 mL IVPB (1 g Intravenous New Bag/Given 03/28/21 1147)    ED Course  I have reviewed the triage vital signs and the nursing notes.  Pertinent labs & imaging results that were available during my care of the patient were reviewed by me and considered in my medical decision making (see chart for details).    MDM Rules/Calculators/A&P                         Differential considered for pelvic, ovarian pain considered includes but is not limited to: appendicitis, diverticulitis, ovarian torsion, cervicitis, endometriosis.  Pelvic exam and bimanual exam are equivocal for cervicitis, some tenderness to palpation without chandelier sign, some white discharge without foul smell.   I personally reviewed all laboratory work and imaging. No significant abnormalities. Benign hemorrhagic cyst noted on left ovary without any signs of torsion.  Discussed with patient and agreed mutually to treat presumptively for gonorrhea and chlamydia.  IV Rocephin given prior to discharge doxycycline sent to her pharmacy.  Patient was encouraged to follow-up with her  OB/GYN for further evaluation of her pain worsens or fails to improve.  Extensive return precautions given.  Final Clinical Impression(s) / ED Diagnoses Final diagnoses:  Pelvic pain    Rx / DC Orders ED Discharge Orders          Ordered    doxycycline (VIBRA-TABS) 100 MG tablet  2 times daily        03/28/21 1135             Pamalee Marcoe, [place] H, PA-C 03/28/21 1223    Virgina Norfolk, DO 03/28/21 1223

## 2021-03-28 NOTE — Discharge Instructions (Addendum)
As we discussed your work-up today was overall reassuring, the ultrasound evaluation showed a small benign hemorrhagic cyst within the left ovary without evidence of rupture, ovarian torsion, inflammation.  Your pelvic exam was minimally concerning for gonorrhea or chlamydia, however you did have some tenderness palpation of your cervix, we discussed and agreed to treat presumptively for these conditions.  As I told you I recommend taking doxycycline on a full stomach and making sure to wear sunscreen if you are outside.  Please return if your pain worsens or fails to improve.  I recommend you follow-up with your OB/GYN at your earliest convenience.

## 2021-03-28 NOTE — ED Triage Notes (Signed)
Pt endorses lower abdominal pain that began last night. Pt denies N/V/D and abnormal vaginal discharge/bleeding.

## 2021-03-30 LAB — GC/CHLAMYDIA PROBE AMP (~~LOC~~) NOT AT ARMC
Chlamydia: NEGATIVE
Comment: NEGATIVE
Comment: NORMAL
Neisseria Gonorrhea: NEGATIVE

## 2021-07-06 ENCOUNTER — Ambulatory Visit: Payer: 59 | Admitting: Family Medicine

## 2021-08-12 ENCOUNTER — Encounter: Payer: Self-pay | Admitting: Family Medicine

## 2021-08-12 ENCOUNTER — Other Ambulatory Visit (HOSPITAL_COMMUNITY)
Admission: RE | Admit: 2021-08-12 | Discharge: 2021-08-12 | Disposition: A | Discharge status: Home / Self Care | Payer: Commercial Managed Care - HMO | Source: Ambulatory Visit | Attending: Family Medicine | Admitting: Family Medicine

## 2021-08-12 ENCOUNTER — Ambulatory Visit (INDEPENDENT_AMBULATORY_CARE_PROVIDER_SITE_OTHER): Payer: Managed Care, Other (non HMO) | Admitting: Family Medicine

## 2021-08-12 VITALS — BP 118/82 | HR 82 | Temp 98.5°F | Ht 61.0 in | Wt 178.2 lb

## 2021-08-12 DIAGNOSIS — Z113 Encounter for screening for infections with a predominantly sexual mode of transmission: Secondary | ICD-10-CM

## 2021-08-12 DIAGNOSIS — R6889 Other general symptoms and signs: Secondary | ICD-10-CM

## 2021-08-12 DIAGNOSIS — Z124 Encounter for screening for malignant neoplasm of cervix: Secondary | ICD-10-CM | POA: Diagnosis not present

## 2021-08-12 DIAGNOSIS — L309 Dermatitis, unspecified: Secondary | ICD-10-CM | POA: Diagnosis not present

## 2021-08-12 DIAGNOSIS — G4700 Insomnia, unspecified: Secondary | ICD-10-CM

## 2021-08-12 DIAGNOSIS — E785 Hyperlipidemia, unspecified: Secondary | ICD-10-CM | POA: Diagnosis not present

## 2021-08-12 DIAGNOSIS — Z0000 Encounter for general adult medical examination without abnormal findings: Secondary | ICD-10-CM

## 2021-08-12 LAB — CBC WITH DIFFERENTIAL/PLATELET
Basophils Absolute: 0 10*3/uL (ref 0.0–0.1)
Basophils Relative: 0.3 % (ref 0.0–3.0)
Eosinophils Absolute: 0.1 10*3/uL (ref 0.0–0.7)
Eosinophils Relative: 1 % (ref 0.0–5.0)
HCT: 41.4 % (ref 36.0–46.0)
Hemoglobin: 13.6 g/dL (ref 12.0–15.0)
Lymphocytes Relative: 17.8 % (ref 12.0–46.0)
Lymphs Abs: 1.5 10*3/uL (ref 0.7–4.0)
MCHC: 32.9 g/dL (ref 30.0–36.0)
MCV: 84.6 fl (ref 78.0–100.0)
Monocytes Absolute: 0.6 10*3/uL (ref 0.1–1.0)
Monocytes Relative: 7 % (ref 3.0–12.0)
Neutro Abs: 6.1 10*3/uL (ref 1.4–7.7)
Neutrophils Relative %: 73.9 % (ref 43.0–77.0)
Platelets: 400 10*3/uL (ref 150.0–400.0)
RBC: 4.89 Mil/uL (ref 3.87–5.11)
RDW: 12.8 % (ref 11.5–15.5)
WBC: 8.2 10*3/uL (ref 4.0–10.5)

## 2021-08-12 LAB — COMPREHENSIVE METABOLIC PANEL
ALT: 18 U/L (ref 0–35)
AST: 17 U/L (ref 0–37)
Albumin: 4.4 g/dL (ref 3.5–5.2)
Alkaline Phosphatase: 60 U/L (ref 39–117)
BUN: 15 mg/dL (ref 6–23)
CO2: 28 mEq/L (ref 19–32)
Calcium: 9.7 mg/dL (ref 8.4–10.5)
Chloride: 102 mEq/L (ref 96–112)
Creatinine, Ser: 0.67 mg/dL (ref 0.40–1.20)
GFR: 118.94 mL/min (ref >60.00)
Glucose, Bld: 86 mg/dL (ref 70–99)
Potassium: 4.1 mEq/L (ref 3.5–5.1)
Sodium: 138 mEq/L (ref 135–145)
Total Bilirubin: 0.4 mg/dL (ref 0.2–1.2)
Total Protein: 8.4 g/dL — ABNORMAL HIGH (ref 6.0–8.3)

## 2021-08-12 LAB — LIPID PANEL
Cholesterol: 200 mg/dL (ref 0–200)
HDL: 48.4 mg/dL (ref >39.00)
LDL Cholesterol: 140 mg/dL — ABNORMAL HIGH (ref 0–99)
NonHDL: 151.41
Total CHOL/HDL Ratio: 4
Triglycerides: 59 mg/dL (ref 0.0–149.0)
VLDL: 11.8 mg/dL (ref 0.0–40.0)

## 2021-08-12 LAB — HEMOGLOBIN A1C: Hgb A1c MFr Bld: 5.7 % (ref 4.6–6.5)

## 2021-08-12 LAB — TSH: TSH: 1.68 u[IU]/mL (ref 0.35–5.50)

## 2021-08-12 LAB — T4, FREE: Free T4: 0.95 ng/dL (ref 0.60–1.60)

## 2021-08-12 MED ORDER — TRIAMCINOLONE ACETONIDE 0.1 % EX CREA: 1.0000 "application " | TOPICAL_CREAM | Freq: Two times a day (BID) | CUTANEOUS | 0 refills | Status: DC

## 2021-08-12 NOTE — Patient Instructions (Signed)
You can try over-the-counter melatonin 3-5 mg nightly to help with sleep.  Melatonin can be found over-the-counter at your local drugstore, [employer], [employer], or online.

## 2021-08-12 NOTE — Progress Notes (Signed)
Subjective:     Tracy Haynes is a 31 y.o. female and is here for a comprehensive physical exam. The patient reports difficulty following with sleep.  States gets off work around [employer] 10:30 pm, wakes up early to take her daughter to school in am.  May not fall asleep until 2 AM.  States mind often races.  Denies increased stress.  Avoids caffeine late in the day.  Patient also notes rash on bilateral forearms, creases of elbow, and bilateral medial thighs.  Rash appeared x1-2 weeks ago.  It is pruritic.  Tried Goldbond lotion which seems to help.  Patient takes hot showers.  Notes skin has been dry.  Patient inquires about STI testing.  Last Pap 2019.  Vaginal irritation.  Denies changes in soaps, lotions, or detergents.  Using Dove sensitive skin body wash.  Inquires about iron testing as it is cold all the time. Social History   Socioeconomic History   Marital status: Single    Spouse name: Not on file   Number of children: 1   Years of education: Not on file   Highest education level: Master's degree (e.g., MA, MS, MEng, MEd, MSW, MBA)  Occupational History   Not on file  Tobacco Use   Smoking status: Never   Smokeless tobacco: Never  Vaping Use   Vaping Use: Never used  Substance and Sexual Activity   Alcohol use: No    Comment: quit 2018   Drug use: No   Sexual activity: Yes    Birth control/protection: Implant    Comment: NEXPLANON  Other Topics Concern   Not on file  Social History Narrative   Lives at home with her daughter   Right handed   Quit caffeine February 2019.   Social Determinants of Health   Financial Resource Strain: Not on file  Food Insecurity: Not on file  Transportation Needs: Not on file  Physical Activity: Not on file  Stress: Not on file  Social Connections: Not on file  Intimate Partner Violence: Not on file   Health Maintenance  Topic Date Due   COVID-19 Vaccine (1) Never done   INFLUENZA VACCINE  03/02/2021   PAP-Cervical Cytology  Screening  07/18/2021   PAP SMEAR-Modifier  07/18/2021   TETANUS/TDAP  12/17/2024   Hepatitis C Screening  Completed   HIV Screening  Completed   Pneumococcal Vaccine 81-53 Years old  Aged Out   HPV VACCINES  Aged Out    The following portions of the patient's history were reviewed and updated as appropriate: allergies, current medications, past family history, past medical history, past social history, past surgical history, and problem list.  Review of Systems Pertinent items noted in HPI and remainder of comprehensive ROS otherwise negative.   Objective:    BP 118/82 (BP Location: Right Arm, Patient Position: Sitting, Cuff Size: Normal)    Pulse 82    Temp 98.5 F (36.9 C) (Oral)    Ht 5\' 1"  (1.549 m)    Wt 178 lb 3.2 oz (80.8 kg)    SpO2 97%    BMI 33.67 kg/m  General appearance: alert, cooperative, and no distress Head: Normocephalic, without obvious abnormality, atraumatic Eyes: conjunctivae/corneas clear. PERRL, EOM's intact. Fundi benign. Ears: normal TM's and external ear canals both ears Nose: Nares normal. Septum midline. Mucosa normal. No drainage or sinus tenderness. Throat: lips, mucosa, and tongue normal; teeth and gums normal Neck: no adenopathy, no carotid bruit, no JVD, supple, symmetrical, trachea midline, and thyroid  not enlarged, symmetric, no tenderness/mass/nodules Lungs: clear to auscultation bilaterally Heart: regular rate and rhythm, S1, S2 normal, no murmur, click, rub or gallop Abdomen: soft, non-tender; bowel sounds normal; no masses,  no organomegaly Pelvic: cervix normal in appearance, external genitalia normal, no adnexal masses or tenderness, no cervical motion tenderness, rectovaginal septum normal, uterus normal size, shape, and consistency, and vaginal Rougae with mild erythema, white smooth consistency d/c at introitus, vagina, and at cervix.  Pap collected with scant amount of blood noted on broom and discharge with mucus-like  consistency. Extremities: extremities normal, atraumatic, no cyanosis or edema Pulses: 2+ and symmetric Skin: Skin color, texture, turgor normal.  Bilateral forearms and creases of bilateral elbows with rough appearing skin colored lesions.  Medial bilateral thighs with hyperpigmented areas of dry appearing plaques with ecchymosis. Lymph nodes: Cervical, supraclavicular, and axillary nodes normal. Neurologic: Alert and oriented X 3, normal strength and tone. Normal symmetric reflexes. Normal coordination and gait    Assessment:    Healthy female exam with vaginal irritation and d/c noted on pap.   Plan:    Anticipatory guidance given including wearing seatbelts, smoke detectors in the home, increasing physical activity, increasing p.o. intake of water and vegetables. -Pap done next visit. -Consider influenza vaccine -Given handout -Next CPE in 1 year See After Visit Summary for Counseling Recommendations   Routine screening for STI (sexually transmitted infection) -Last pap 2019 normal with the exception of yeast. - Plan: RPR, HIV Antibody (routine testing w rflx), PAP [[facility]]  Cervical cancer screening -Last pap 07/18/2018 normal with the exception of BV. - Plan: PAP [[facility]]  Eczema, unspecified type  -Avoid taking hot showers, use gentle, lotions, detergents.  Using a good moisturizing cream -Given handout - Plan: triamcinolone cream (KENALOG) 0.1 %  Cold intolerance -Assess for anemia - Plan: CBC with Differential/Platelet, TSH, T4, Free, Iron, TIBC and Ferritin Panel  Hyperlipidemia, unspecified hyperlipidemia type  -LDL 129 06/16/2018 -Lifestyle modifications - Plan: Lipid panel  Insomnia, unspecified -Sleep hygiene -We will try OTC melatonin 3-5 mg nightly -Follow-up in 1 month, sooner if needed  F/u in 1 month prn  Abbe Amsterdam, MD

## 2021-08-13 LAB — IRON,TIBC AND FERRITIN PANEL
%SAT: 32 % (calc) (ref 16–45)
Ferritin: 75 ng/mL (ref 16–154)
Iron: 112 ug/dL (ref 40–190)
TIBC: 349 mcg/dL (calc) (ref 250–450)

## 2021-08-13 LAB — CYTOLOGY - PAP
Adequacy: ABSENT
Chlamydia: NEGATIVE
Comment: NEGATIVE
Comment: NEGATIVE
Comment: NORMAL
Diagnosis: NEGATIVE
Neisseria Gonorrhea: NEGATIVE
Trichomonas: NEGATIVE

## 2021-08-13 LAB — RPR: RPR Ser Ql: NONREACTIVE

## 2021-08-13 LAB — HIV ANTIBODY (ROUTINE TESTING W REFLEX): HIV 1&2 Ab, 4th Generation: NONREACTIVE

## 2021-08-17 ENCOUNTER — Encounter: Payer: Self-pay | Admitting: Family Medicine

## 2021-09-08 ENCOUNTER — Other Ambulatory Visit: Payer: Self-pay | Admitting: Family Medicine

## 2021-09-08 DIAGNOSIS — L309 Dermatitis, unspecified: Secondary | ICD-10-CM

## 2021-09-08 MED ORDER — TRIAMCINOLONE ACETONIDE 0.1 % EX CREA: 1.0000 "application " | TOPICAL_CREAM | Freq: Two times a day (BID) | CUTANEOUS | 0 refills | Status: DC

## 2021-09-11 ENCOUNTER — Telehealth: Payer: Self-pay | Admitting: Family Medicine

## 2021-09-11 NOTE — Telephone Encounter (Signed)
Confirmed that pt had concerns regarding the triamcinolone cream. Confirmed that it went to correct pharmacy & pharmacy confirmed receipt electronically. Pt advised to discuss issue with pharmacy. Pt verb understanding.

## 2021-09-11 NOTE — Telephone Encounter (Signed)
Patient states that her MyChart states her RX is available at [facility] She is calling to see if RX was actually called in because she is at [facility] to pick up RX  Patient request a call back at [phone]

## 2021-10-08 ENCOUNTER — Ambulatory Visit (INDEPENDENT_AMBULATORY_CARE_PROVIDER_SITE_OTHER): Payer: 59 | Admitting: Family Medicine

## 2021-10-08 ENCOUNTER — Other Ambulatory Visit (HOSPITAL_COMMUNITY)
Admission: RE | Admit: 2021-10-08 | Discharge: 2021-10-08 | Disposition: A | Discharge status: Home / Self Care | Payer: Self-pay | Source: Ambulatory Visit | Attending: Family Medicine | Admitting: Family Medicine

## 2021-10-08 ENCOUNTER — Encounter: Payer: Self-pay | Admitting: Family Medicine

## 2021-10-08 VITALS — BP 123/86 | HR 91 | Temp 98.6°F | Wt 179.8 lb

## 2021-10-08 DIAGNOSIS — N898 Other specified noninflammatory disorders of vagina: Secondary | ICD-10-CM

## 2021-10-08 DIAGNOSIS — L309 Dermatitis, unspecified: Secondary | ICD-10-CM

## 2021-10-08 MED ORDER — KETOCONAZOLE 2 % EX CREA: 1.0000 "application " | TOPICAL_CREAM | Freq: Every day | CUTANEOUS | 0 refills | Status: DC

## 2021-10-08 NOTE — Progress Notes (Signed)
Subjective:  ? ? Patient ID: Tracy Haynes, female    DOB: 14-Oct-1990, 31 y.o.   MRN: [id number] ? ?Chief Complaint  ?Patient presents with  ? Rash  ?  Last visit was informed it was eczem, but the cream has not helped. Has refilled it and still no improvement.  ? Exposure to STD  ? ? ?HPI ?Patient was seen today for acute concern and f/u.  Pt notes continued rash on creases of b/l elbows, medial thighs, and upper abdomen.  Tried kenalog 0.1% cream without improvement. Denies changes in soaps, lotions, or detergents.  No changes in foods.  Endorses intermittent full body pruritis. ? ?Pt notes vaginal dryness and odor.  Denies d/c.  Using lubricant prn.  Requesting STI testing. ? ?Also inquires about how to lower cholesterol. ? ?Past Medical History:  ?Diagnosis Date  ? Chlamydia infection   ? Gonorrhea   ? H/O varicella   ? Hx: UTI (urinary tract infection)   ? ? ?Allergies  ?Allergen Reactions  ? Blueberry [Vaccinium Angustifolium] Nausea And Vomiting  ? ? ?ROS ?General: Denies fever, chills, night sweats, changes in weight, changes in appetite ?HEENT: Denies headaches, ear pain, changes in vision, rhinorrhea, sore throat ?CV: Denies CP, palpitations, SOB, orthopnea ?Pulm: Denies SOB, cough, wheezing ?GI: Denies abdominal pain, nausea, vomiting, diarrhea, constipation ?GU: Denies dysuria, hematuria, frequency +vaginal odor and dryness ?Msk: Denies muscle cramps, joint pains ?Neuro: Denies weakness, numbness, tingling ?Skin: Denies rashes, bruising  +rash ?Psych: Denies depression, anxiety, hallucinations ?   ?Objective:  ?  ?Blood pressure 123/86, pulse 91, temperature 98.6 ?F (37 ?C), temperature source Oral, weight 179 lb 12.8 oz (81.6 kg), SpO2 98 %. ? ?Gen. Pleasant, well-nourished, in no distress, normal affect   ?HEENT: [place]/AT, face symmetric, conjunctiva clear, no scleral icterus, PERRLA, EOMI, nares patent without drainage ?Lungs: no accessory muscle use ?Cardiovascular: RRR, no peripheral edema ?GU:  aptima self swab collected. ?Musculoskeletal: No deformities, no cyanosis or clubbing, normal tone ?Neuro:  A&Ox3, CN II-XII intact, normal gait ?Skin:  Warm, dry, intact.  B/l ,medial upper thighs with hyperpigmented flat 58mm papules. A hyperpigmented, rough appearing 1.5 cm plaque on R upper medial thigh.  B/l creases of elbows with hypopigmented/flesh colored area in center with 2 mm hyperpigmented areas surrounding.  Sparse, similar hyperpigmented 2 mm papules on upper abdomen. ? ? ?Wt Readings from Last 3 Encounters:  ?10/08/21 179 lb 12.8 oz (81.6 kg)  ?08/12/21 178 lb 3.2 oz (80.8 kg)  ?02/26/21 177 lb 9.6 oz (80.6 kg)  ? ? ?Lab Results  ?Component Value Date  ? WBC 8.2 08/12/2021  ? HGB 13.6 08/12/2021  ? HCT 41.4 08/12/2021  ? PLT 400.0 08/12/2021  ? GLUCOSE 86 08/12/2021  ? CHOL 200 08/12/2021  ? TRIG 59.0 08/12/2021  ? HDL 48.40 08/12/2021  ? LDLCALC 140 (H) 08/12/2021  ? ALT 18 08/12/2021  ? AST 17 08/12/2021  ? NA 138 08/12/2021  ? K 4.1 08/12/2021  ? CL 102 08/12/2021  ? CREATININE 0.67 08/12/2021  ? BUN 15 08/12/2021  ? CO2 28 08/12/2021  ? TSH 1.68 08/12/2021  ? HGBA1C 5.7 08/12/2021  ? ? ?Assessment/Plan: ? ?Vaginal odor  ?-Aptima self swab collected to evaluate for possible infection such as BV ?-further recs based on Aptima results ?-continue using gentle soaps, lotions, or detergents ?- Plan: Cervicovaginal ancillary only ? ?Dermatitis  ?-referral to Derm ?- Plan: ketoconazole (NIZORAL) 2 % cream, Ambulatory referral to Dermatology ? ?F/u prn ? ?Abbe Amsterdam,  MD ?

## 2021-10-09 LAB — CERVICOVAGINAL ANCILLARY ONLY
Bacterial Vaginitis (gardnerella): NEGATIVE
Candida Glabrata: NEGATIVE
Candida Vaginitis: NEGATIVE
Chlamydia: NEGATIVE
Comment: NEGATIVE
Comment: NEGATIVE
Comment: NEGATIVE
Comment: NEGATIVE
Comment: NEGATIVE
Comment: NORMAL
Neisseria Gonorrhea: NEGATIVE
Trichomonas: NEGATIVE

## 2022-06-11 ENCOUNTER — Other Ambulatory Visit (HOSPITAL_COMMUNITY)
Admission: RE | Admit: 2022-06-11 | Discharge: 2022-06-11 | Disposition: A | Discharge status: Home / Self Care | Payer: No Typology Code available for payment source | Source: Ambulatory Visit | Attending: Family Medicine | Admitting: Family Medicine

## 2022-06-11 ENCOUNTER — Ambulatory Visit (INDEPENDENT_AMBULATORY_CARE_PROVIDER_SITE_OTHER): Payer: No Typology Code available for payment source | Admitting: Family Medicine

## 2022-06-11 VITALS — BP 126/98 | HR 83 | Temp 98.6°F | Wt 179.8 lb

## 2022-06-11 DIAGNOSIS — K219 Gastro-esophageal reflux disease without esophagitis: Secondary | ICD-10-CM

## 2022-06-11 DIAGNOSIS — R0789 Other chest pain: Secondary | ICD-10-CM | POA: Diagnosis not present

## 2022-06-11 DIAGNOSIS — Z113 Encounter for screening for infections with a predominantly sexual mode of transmission: Secondary | ICD-10-CM

## 2022-06-11 DIAGNOSIS — R519 Headache, unspecified: Secondary | ICD-10-CM | POA: Diagnosis not present

## 2022-06-11 MED ORDER — PANTOPRAZOLE SODIUM 20 MG PO TBEC: 20.0000 mg | DELAYED_RELEASE_TABLET | Freq: Every day | ORAL | 3 refills | Status: DC

## 2022-06-11 NOTE — Patient Instructions (Signed)
EKG looks good.

## 2022-06-11 NOTE — Progress Notes (Signed)
Subjective:    Patient ID: Tracy Haynes, female    DOB: Feb 18, 1991, 31 y.o.   MRN: [id number]  Chief Complaint  Patient presents with   Pain    Chest pressure pain and headaches,  did not do anything before it started. Has been taken bayer aspirin, it is helping to where she can manage it but does not take pain away. Feels like something is sitting on her chest in the middle.  HA was only for one day   Follow-up    Check for BV    HPI Patient was seen today for acute concern.  Pt with chest pressure since last Wednesday.  Pt state she was at work when sensation occurred.  Does not recall eating or drinking anything at the time.  Patient endorses increased belching.  Also notes occasional cough and clearing of throat.  Patient denies bloating, sore throat, constipation, flatus.  Patient took aspirin for symptoms which seems to help.  Patient also noted headache yesterday during the sensation.  Tried aspirin for discomfort.  Past Medical History:  Diagnosis Date   Chlamydia infection    Gonorrhea    H/O varicella    Hx: UTI (urinary tract infection)     Allergies  Allergen Reactions   Blueberry [Vaccinium Angustifolium] Nausea And Vomiting    ROS General: Denies fever, chills, night sweats, changes in weight, changes in appetite HEENT: Denies ear pain, changes in vision, rhinorrhea, sore throat +headache CV: Denies palpitations, SOB, orthopnea + CP Pulm: Denies SOB, cough, wheezing GI: Denies abdominal pain, nausea, vomiting, diarrhea, constipation  + increased belching GU: Denies dysuria, hematuria, frequency, vaginal discharge Msk: Denies muscle cramps, joint pains Neuro: Denies weakness, numbness, tingling Skin: Denies rashes, bruising Psych: Denies depression, anxiety, hallucinations      Objective:    Blood pressure (!) 126/98, pulse 83, temperature 98.6 F (37 C), temperature source Oral, weight 179 lb 12.8 oz (81.6 kg).  Gen. Pleasant, well-nourished, in no  distress, normal affect   HEENT: [place]/AT, face symmetric, conjunctiva clear, no scleral icterus, PERRLA, EOMI, nares patent without drainage, Neck: No JVD, no thyromegaly, no carotid bruits Lungs: no accessory muscle use, CTAB, no wheezes or rales Cardiovascular: RRR, no m/r/g, no peripheral edema. GU: Aptima self swab collected Musculoskeletal: No TTP of chest.  No deformities, no cyanosis or clubbing, normal tone Neuro:  A&Ox3, CN II-XII intact, normal gait Skin:  Warm, no lesions/ rash   Wt Readings from Last 3 Encounters:  06/11/22 179 lb 12.8 oz (81.6 kg)  10/08/21 179 lb 12.8 oz (81.6 kg)  08/12/21 178 lb 3.2 oz (80.8 kg)    Lab Results  Component Value Date   WBC 8.2 08/12/2021   HGB 13.6 08/12/2021   HCT 41.4 08/12/2021   PLT 400.0 08/12/2021   GLUCOSE 86 08/12/2021   CHOL 200 08/12/2021   TRIG 59.0 08/12/2021   HDL 48.40 08/12/2021   LDLCALC 140 (H) 08/12/2021   ALT 18 08/12/2021   AST 17 08/12/2021   NA 138 08/12/2021   K 4.1 08/12/2021   CL 102 08/12/2021   CREATININE 0.67 08/12/2021   BUN 15 08/12/2021   CO2 28 08/12/2021   TSH 1.68 08/12/2021   HGBA1C 5.7 08/12/2021    Assessment/Plan:  Chest pressure -EKG NSR, VR 74, nonspecific T wave inversion in lead III.  Compared to previous studies from 10/30/2012 10/24/2012 -Discussed possible causes including GERD, anxiety, elevated BP -Recheck BP -Continue to monitor -Given strict precautions - Plan: EKG 12-Lead,  pantoprazole (PROTONIX) 20 MG tablet  Acute nonintractable headache, unspecified headache type -Discussed headache prevention including rest, hydration, limiting caffeine intake -For continued or worsening symptoms follow-up with neurology  Routine screening for STI (sexually transmitted infection) - Plan: Cervicovaginal ancillary only  Gastroesophageal reflux disease, unspecified whether esophagitis present  -Discussed foods known to cause symptoms -Given handout -Start Protonix 20 mg  daily -For continued or worsening symptoms follow-up with GI - Plan: pantoprazole (PROTONIX) 20 MG tablet  F/u as needed  Grier Mitts, MD

## 2022-06-15 LAB — CERVICOVAGINAL ANCILLARY ONLY
Bacterial Vaginitis (gardnerella): NEGATIVE
Candida Glabrata: NEGATIVE
Candida Vaginitis: NEGATIVE
Chlamydia: NEGATIVE
Comment: NEGATIVE
Comment: NEGATIVE
Comment: NEGATIVE
Comment: NEGATIVE
Comment: NEGATIVE
Comment: NORMAL
Neisseria Gonorrhea: NEGATIVE
Trichomonas: NEGATIVE

## 2022-06-30 ENCOUNTER — Encounter: Payer: Self-pay | Admitting: Family Medicine

## 2022-07-26 IMAGING — US US PELVIS COMPLETE TRANSABD/TRANSVAG W DUPLEX
1 series · 13 of 25 positions shown · non-contrast
Comparison: None.

CLINICAL DATA: Pelvic pain since last night.

EXAM:
TRANSABDOMINAL AND TRANSVAGINAL ULTRASOUND OF PELVIS
DOPPLER ULTRASOUND OF OVARIES
TECHNIQUE: Both transabdominal and transvaginal ultrasound examinations of the
pelvis were performed. Transabdominal technique was performed for
global imaging of the pelvis including uterus, ovaries, adnexal
regions, and pelvic cul-de-sac.
It was necessary to proceed with endovaginal exam following the
transabdominal exam to visualize the adnexal structures to an
adequate degree. Color and duplex Doppler ultrasound was utilized to
evaluate blood flow to the ovaries.

[Series 1: us pelvis complete transabd/transvag w duplex · 13 of 147 slices shown]
[im 1/147]
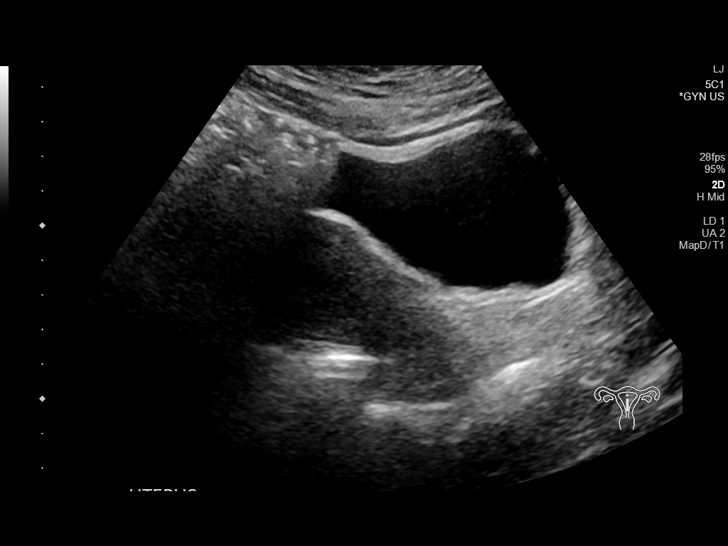
[im 13/147]
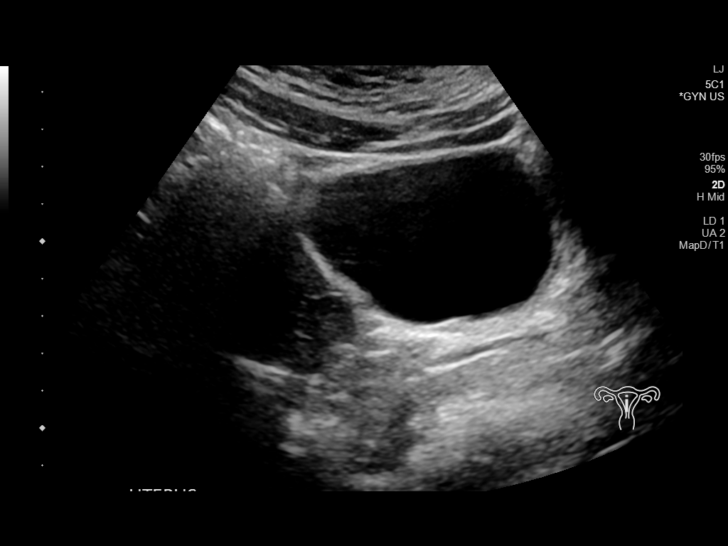
[im 25/147]
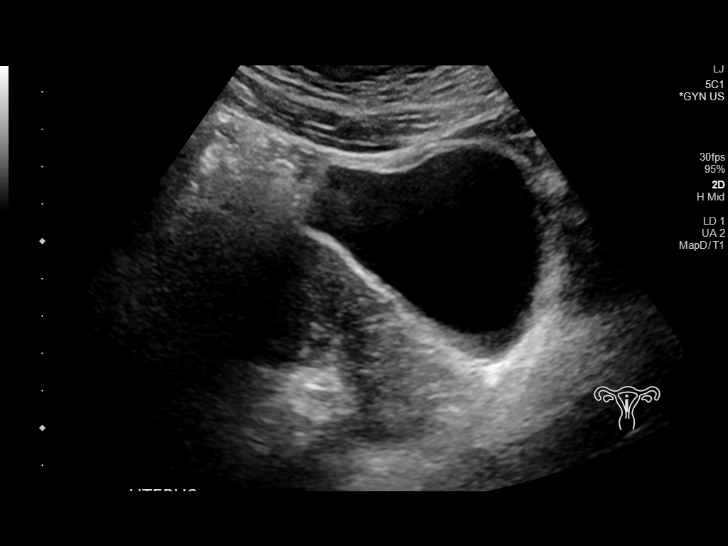
[im 37/147]
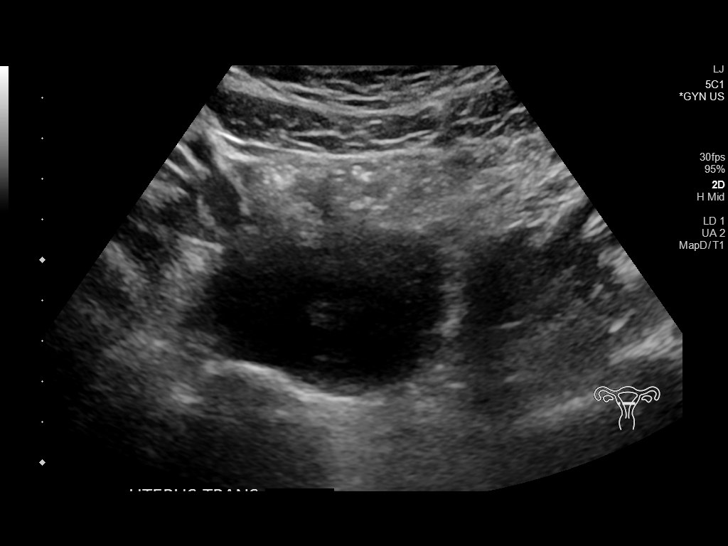
[im 49/147]
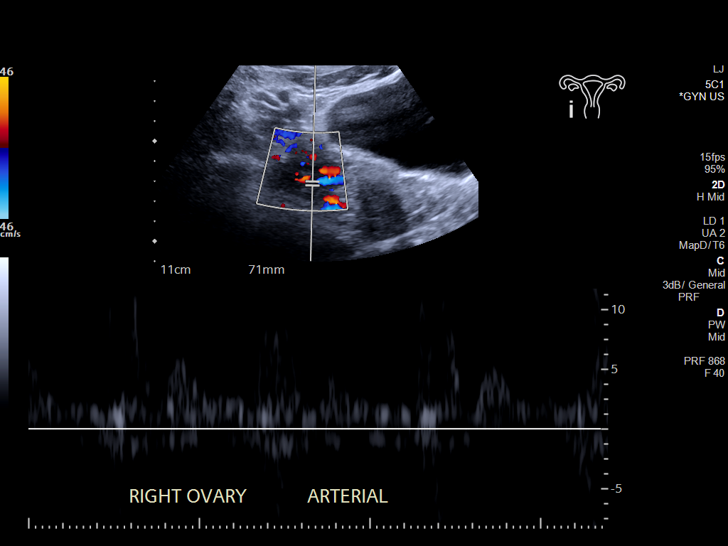
[im 61/147]
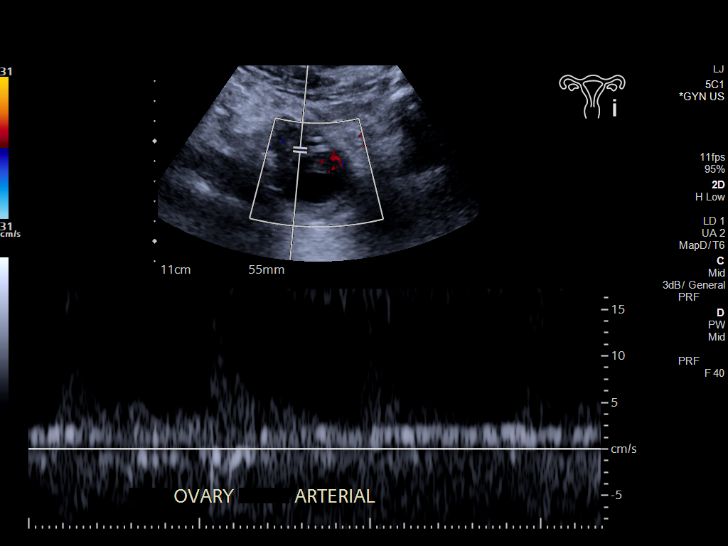
[im 74/147]
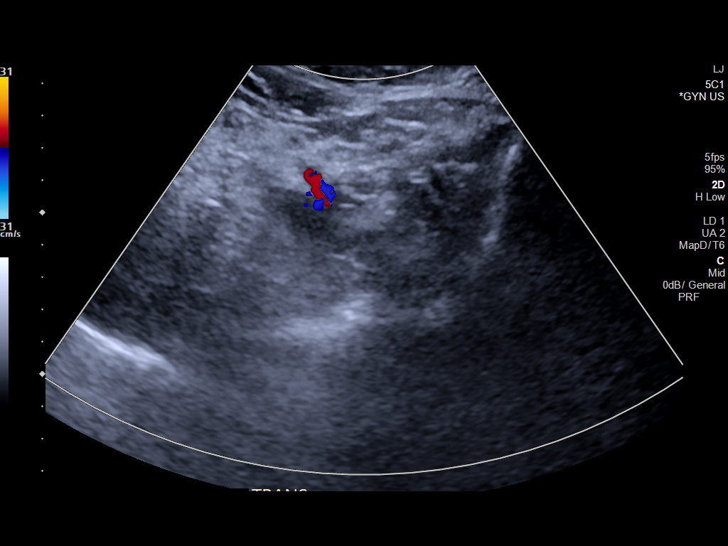
[im 86/147]
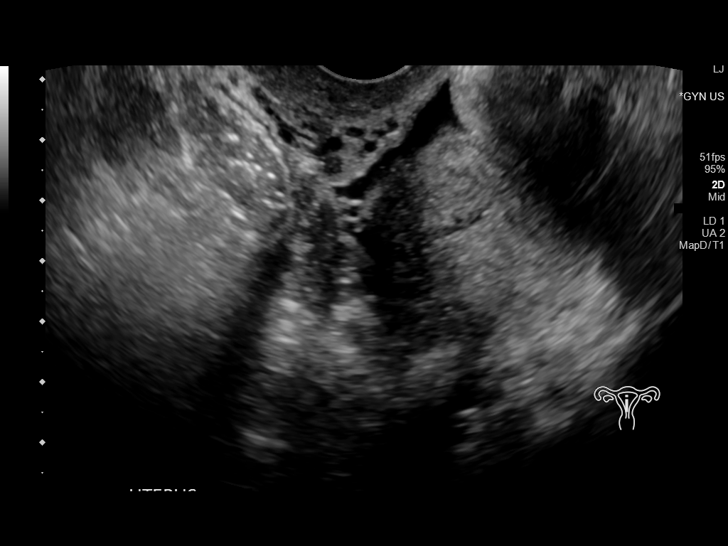
[im 98/147]
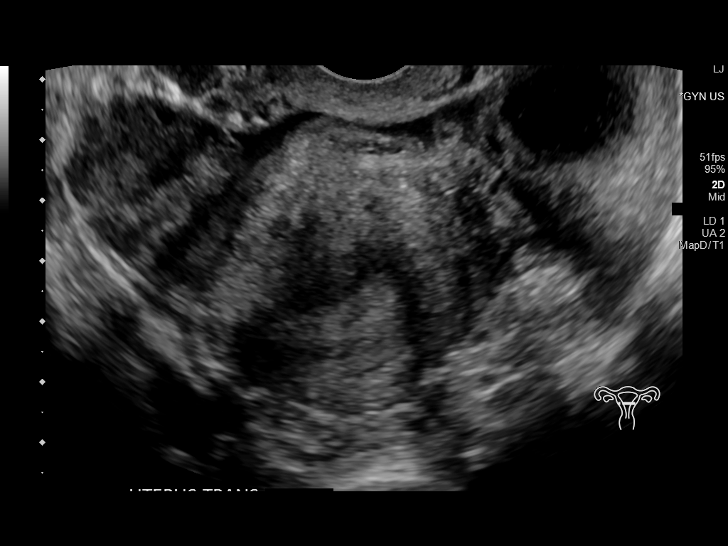
[im 110/147]
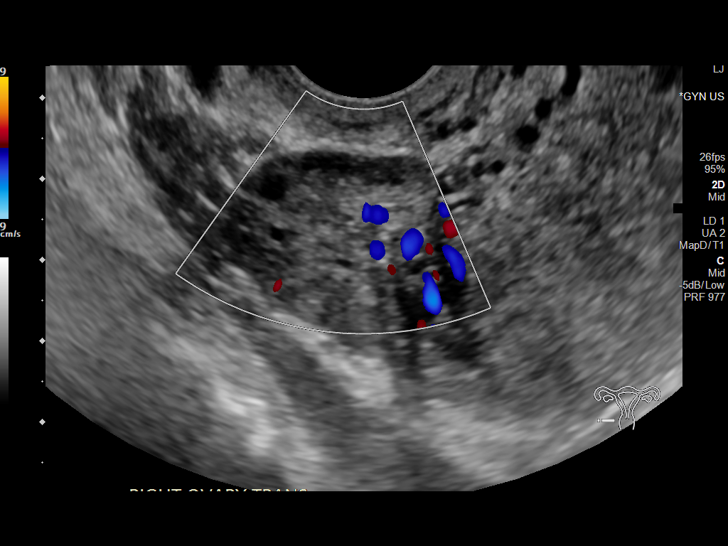
[im 122/147]
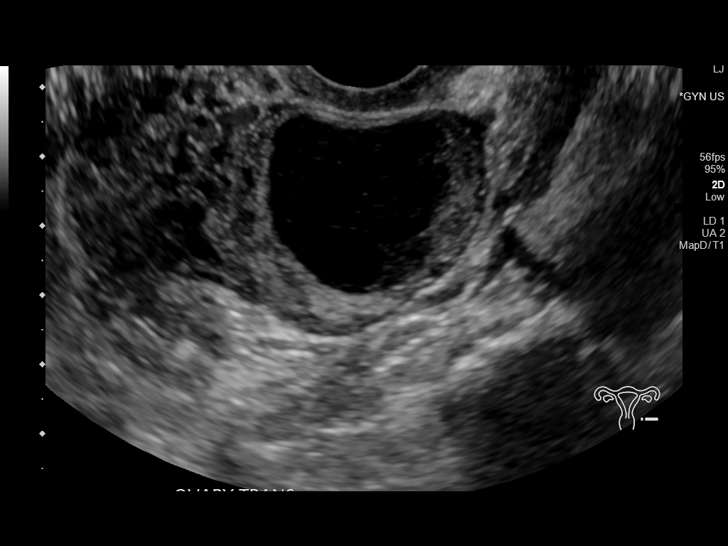
[im 134/147]
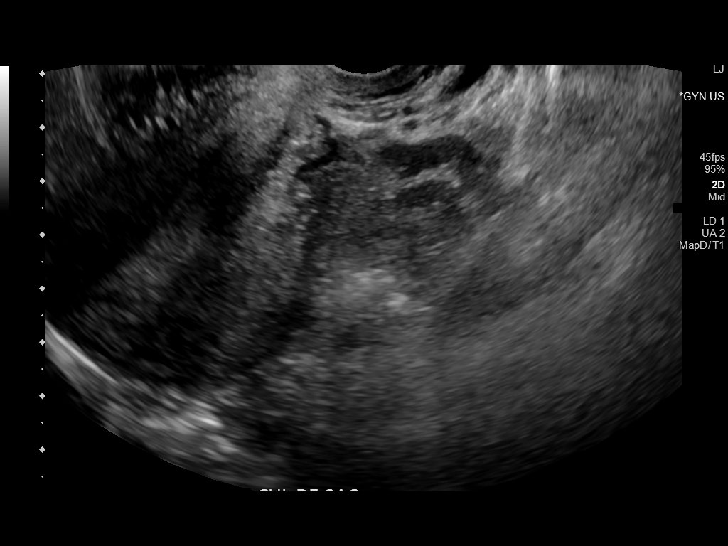
[im 147/147]
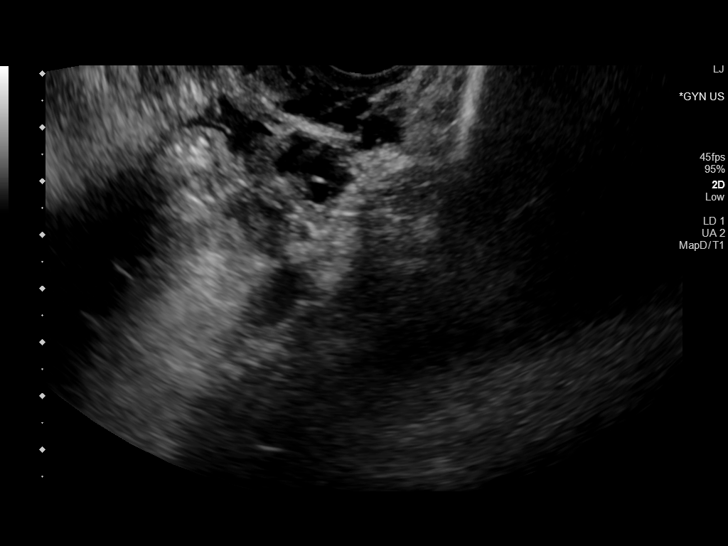

[13 of 25 positions shown; findings below may reference images not displayed]

FINDINGS: Uterus

Measurements: 7.9 x 4.6 x 5.0 cm = volume: 94 mL. No fibroids or
other mass visualized.

Endometrium

Thickness: 8 mm.  No focal abnormality visualized.

Right ovary

Measurements: 3.4 x 2.3 x 2.8 cm = volume: 11.1 mL. Normal
appearance/no adnexal mass.

Left ovary

Measurements: 4.2 x 3.1 x 3.8 cm = volume: 27 mL. Small benign
hemorrhagic cyst within the LEFT ovary, measuring 3 x 2.5 cm.

Pulsed Doppler evaluation of both ovaries demonstrates normal
low-resistance arterial and venous waveforms.

Other findings

No abnormal free fluid.
IMPRESSION: 1. Small benign hemorrhagic cyst within the LEFT ovary, measuring 3
cm. No mass or free fluid within the adjacent LEFT adnexal region.
No followup imaging recommended. Note: This recommendation does not
apply to premenarchal patients or to those with increased risk
(genetic, family history, elevated tumor markers or other high-risk
factors) of ovarian cancer. Reference: Radiology [DATE]. Otherwise normal pelvic ultrasound. No evidence of ovarian
torsion bilaterally.

## 2022-09-13 ENCOUNTER — Encounter: Payer: Self-pay | Admitting: Family Medicine

## 2022-09-22 ENCOUNTER — Ambulatory Visit (INDEPENDENT_AMBULATORY_CARE_PROVIDER_SITE_OTHER): Payer: No Typology Code available for payment source | Admitting: Family Medicine

## 2022-09-22 ENCOUNTER — Encounter: Payer: Self-pay | Admitting: Family Medicine

## 2022-09-22 VITALS — BP 118/80 | HR 75 | Temp 98.5°F | Ht 60.75 in | Wt 175.0 lb

## 2022-09-22 DIAGNOSIS — E6609 Other obesity due to excess calories: Secondary | ICD-10-CM | POA: Diagnosis not present

## 2022-09-22 DIAGNOSIS — Z6833 Body mass index (BMI) 33.0-33.9, adult: Secondary | ICD-10-CM | POA: Diagnosis not present

## 2022-09-22 DIAGNOSIS — Z0000 Encounter for general adult medical examination without abnormal findings: Secondary | ICD-10-CM | POA: Diagnosis not present

## 2022-09-22 DIAGNOSIS — E049 Nontoxic goiter, unspecified: Secondary | ICD-10-CM | POA: Diagnosis not present

## 2022-09-22 DIAGNOSIS — Z113 Encounter for screening for infections with a predominantly sexual mode of transmission: Secondary | ICD-10-CM | POA: Diagnosis not present

## 2022-09-22 LAB — CBC WITH DIFFERENTIAL/PLATELET
Basophils Absolute: 0.1 10*3/uL (ref 0.0–0.1)
Basophils Relative: 0.5 % (ref 0.0–3.0)
Eosinophils Absolute: 0.1 10*3/uL (ref 0.0–0.7)
Eosinophils Relative: 1.2 % (ref 0.0–5.0)
HCT: 39.3 % (ref 36.0–46.0)
Hemoglobin: 13.3 g/dL (ref 12.0–15.0)
Lymphocytes Relative: 17.1 % (ref 12.0–46.0)
Lymphs Abs: 1.8 10*3/uL (ref 0.7–4.0)
MCHC: 33.8 g/dL (ref 30.0–36.0)
MCV: 83.8 fl (ref 78.0–100.0)
Monocytes Absolute: 0.6 10*3/uL (ref 0.1–1.0)
Monocytes Relative: 6.1 % (ref 3.0–12.0)
Neutro Abs: 7.7 10*3/uL (ref 1.4–7.7)
Neutrophils Relative %: 75.1 % (ref 43.0–77.0)
Platelets: 436 10*3/uL — ABNORMAL HIGH (ref 150.0–400.0)
RBC: 4.69 Mil/uL (ref 3.87–5.11)
RDW: 13.1 % (ref 11.5–15.5)
WBC: 10.3 10*3/uL (ref 4.0–10.5)

## 2022-09-22 LAB — HEMOGLOBIN A1C: Hgb A1c MFr Bld: 5.6 % (ref 4.6–6.5)

## 2022-09-22 LAB — T4, FREE: Free T4: 0.82 ng/dL (ref 0.60–1.60)

## 2022-09-22 LAB — VITAMIN D 25 HYDROXY (VIT D DEFICIENCY, FRACTURES): VITD: 8.9 ng/mL — ABNORMAL LOW (ref 30.00–100.00)

## 2022-09-22 LAB — TSH: TSH: 2.4 u[IU]/mL (ref 0.35–5.50)

## 2022-09-22 MED ORDER — PHENTERMINE HCL 15 MG PO CAPS: 15.0000 mg | ORAL_CAPSULE | Freq: Every day | ORAL | 1 refills | Status: DC

## 2022-09-22 NOTE — Patient Instructions (Signed)
Your prescription was sent to the pharmacy.  Also consider a referral to weight management clinic as they have a nutritionist who might be helpful in helping you find ways you can make changes in your diet to lose weight.

## 2022-09-22 NOTE — Progress Notes (Signed)
Established Patient Office Visit   Subjective  Patient ID: Tracy Haynes, female    DOB: [birth year]  Age: 32 y.o. MRN: [id number]  Chief Complaint  Patient presents with   Annual Exam    Pt is a 32 yo female who is seen for CPE.  Pt states she is doing well.  Staying busy with work and with her daughter.  Pt interested in wt loss medication options.  Lifting weights 30 min/d, intermittent fasting from 12 pm-8 pm, increasing intake or water and vegetables x 1 month.  Pt also eating more seafood.  Would like to get down to 150 lbs.      ROS Negative unless stated above    Objective:     BP 118/80   Pulse 75   Temp 98.5 F (36.9 C) (Oral)   Ht 5' 0.75" (1.543 m)   Wt 175 lb (79.4 kg)   LMP 09/13/2022   SpO2 95%   BMI 33.34 kg/m    Physical Exam Constitutional:      Appearance: Normal appearance.  HENT:     Head: Normocephalic and atraumatic.     Right Ear: Tympanic membrane, ear canal and external ear normal.     Left Ear: Tympanic membrane, ear canal and external ear normal.     Nose: Nose normal.     Mouth/Throat:     Mouth: Mucous membranes are moist.     Pharynx: No oropharyngeal exudate or posterior oropharyngeal erythema.     Comments: Symmetric goiter noted. Eyes:     General: No scleral icterus.    Extraocular Movements: Extraocular movements intact.     Conjunctiva/sclera: Conjunctivae normal.     Pupils: Pupils are equal, round, and reactive to light.  Neck:     Thyroid: No thyromegaly.  Cardiovascular:     Rate and Rhythm: Normal rate and regular rhythm.     Pulses: Normal pulses.     Heart sounds: Normal heart sounds. No murmur heard.    No friction rub.  Pulmonary:     Effort: Pulmonary effort is normal.     Breath sounds: Normal breath sounds. No wheezing, rhonchi or rales.  Abdominal:     General: Bowel sounds are normal.     Palpations: Abdomen is soft.     Tenderness: There is no abdominal tenderness.  Musculoskeletal:         General: No deformity. Normal range of motion.  Lymphadenopathy:     Cervical: No cervical adenopathy.  Skin:    General: Skin is warm and dry.     Findings: No lesion.  Neurological:     General: No focal deficit present.     Mental Status: She is alert and oriented to person, place, and time.  Psychiatric:        Mood and Affect: Mood normal.        Thought Content: Thought content normal.      No results found for any visits on 09/22/22.    Assessment & Plan:  Well adult exam -Anticipatory guidance given including wearing seatbelts, smoke detectors in the home, increasing physical activity, increasing p.o. intake of water and vegetables. -labs -Immunizations reviewed -Pap up-to-date done 08/13/2021. -Given handout -Next CPE in 1 year -     CBC with Differential/Platelet  Class 1 obesity due to excess calories without serious comorbidity with body mass index (BMI) of 33.0 to 33.9 in adult -Body mass index is 33.34 kg/m. -Some modifications.  Patient encouraged to alternate  weight days with cardio when exercising -     CBC with Differential/Platelet -     TSH -     T4, free -     Hemoglobin A1c -     Lipid panel -     Comprehensive metabolic panel -     VITAMIN D 25 Hydroxy (Vit-D Deficiency, Fractures) -     Phentermine HCl; Take 1 capsule (15 mg total) by mouth daily before breakfast.  Dispense: 30 capsule; Refill: 1  Routine screening for STI (sexually transmitted infection) -     C. trachomatis/N. gonorrhoeae RNA -     HIV Antibody (routine testing w rflx) -     RPR  Goiter -     TSH -     T4, free    Return in about 4 weeks (around 10/20/2022) for weight.   Billie Ruddy, MD

## 2022-09-23 ENCOUNTER — Other Ambulatory Visit: Payer: Self-pay | Admitting: Family Medicine

## 2022-09-23 DIAGNOSIS — E559 Vitamin D deficiency, unspecified: Secondary | ICD-10-CM

## 2022-09-23 LAB — COMPREHENSIVE METABOLIC PANEL
ALT: 16 U/L (ref 0–35)
AST: 17 U/L (ref 0–37)
Albumin: 4.3 g/dL (ref 3.5–5.2)
Alkaline Phosphatase: 66 U/L (ref 39–117)
BUN: 14 mg/dL (ref 6–23)
CO2: 25 mEq/L (ref 19–32)
Calcium: 9.7 mg/dL (ref 8.4–10.5)
Chloride: 101 mEq/L (ref 96–112)
Creatinine, Ser: 0.65 mg/dL (ref 0.40–1.20)
GFR: 118.88 mL/min (ref >60.00)
Glucose, Bld: 66 mg/dL — ABNORMAL LOW (ref 70–99)
Potassium: 3.9 mEq/L (ref 3.5–5.1)
Sodium: 138 mEq/L (ref 135–145)
Total Bilirubin: 0.3 mg/dL (ref 0.2–1.2)
Total Protein: 8.1 g/dL (ref 6.0–8.3)

## 2022-09-23 LAB — LIPID PANEL
Cholesterol: 211 mg/dL — ABNORMAL HIGH (ref 0–200)
HDL: 46.2 mg/dL (ref >39.00)
LDL Cholesterol: 150 mg/dL — ABNORMAL HIGH (ref 0–99)
NonHDL: 164.41
Total CHOL/HDL Ratio: 5
Triglycerides: 72 mg/dL (ref 0.0–149.0)
VLDL: 14.4 mg/dL (ref 0.0–40.0)

## 2022-09-23 LAB — RPR: RPR Ser Ql: NONREACTIVE

## 2022-09-23 LAB — C. TRACHOMATIS/N. GONORRHOEAE RNA
C. trachomatis RNA, TMA: NOT DETECTED
N. gonorrhoeae RNA, TMA: NOT DETECTED

## 2022-09-23 LAB — HIV ANTIBODY (ROUTINE TESTING W REFLEX): HIV 1&2 Ab, 4th Generation: NONREACTIVE

## 2022-09-23 MED ORDER — VITAMIN D (ERGOCALCIFEROL) 1.25 MG (50000 UNIT) PO CAPS: 50000.0000 [IU] | ORAL_CAPSULE | ORAL | 0 refills | Status: DC

## 2022-09-27 ENCOUNTER — Telehealth: Payer: Self-pay

## 2022-09-27 NOTE — Telephone Encounter (Signed)
PA for Phentermine was initiated.  Tracy Haynes (Key: [id number]) PA Case ID #: [id number] Rx #: [id number]

## 2022-09-29 NOTE — Telephone Encounter (Signed)
approved from 09/27/2022 to 12/26/2022

## 2022-09-29 NOTE — Telephone Encounter (Signed)
Reach out to pt's pharmacy and inform them of the update on phentermine.

## 2022-11-17 ENCOUNTER — Other Ambulatory Visit: Payer: Self-pay | Admitting: Family Medicine

## 2022-11-17 DIAGNOSIS — E559 Vitamin D deficiency, unspecified: Secondary | ICD-10-CM

## 2023-04-13 ENCOUNTER — Ambulatory Visit: Payer: No Typology Code available for payment source | Admitting: Family Medicine

## 2023-04-13 VITALS — BP 108/72 | HR 81 | Temp 98.1°F | Ht 60.75 in | Wt 175.6 lb

## 2023-04-13 DIAGNOSIS — Z6833 Body mass index (BMI) 33.0-33.9, adult: Secondary | ICD-10-CM

## 2023-04-13 DIAGNOSIS — K219 Gastro-esophageal reflux disease without esophagitis: Secondary | ICD-10-CM | POA: Diagnosis not present

## 2023-04-13 DIAGNOSIS — E6609 Other obesity due to excess calories: Secondary | ICD-10-CM

## 2023-04-13 DIAGNOSIS — Z113 Encounter for screening for infections with a predominantly sexual mode of transmission: Secondary | ICD-10-CM | POA: Diagnosis not present

## 2023-04-13 DIAGNOSIS — B370 Candidal stomatitis: Secondary | ICD-10-CM | POA: Diagnosis not present

## 2023-04-13 MED ORDER — MAGIC MOUTHWASH
ORAL | 0 refills | Status: DC
Start: 2023-04-13 — End: 2023-10-10

## 2023-04-13 MED ORDER — PANTOPRAZOLE SODIUM 20 MG PO TBEC
20.0000 mg | DELAYED_RELEASE_TABLET | Freq: Every day | ORAL | 3 refills | Status: DC
Start: 2023-04-13 — End: 2023-10-10

## 2023-04-13 MED ORDER — PHENTERMINE HCL 15 MG PO CAPS
15.0000 mg | ORAL_CAPSULE | Freq: Every day | ORAL | 1 refills | Status: DC
Start: 2023-04-13 — End: 2024-04-25

## 2023-04-13 NOTE — Progress Notes (Signed)
Established Patient Office Visit   Subjective  Patient ID: Tracy Haynes, female    DOB: Aug 08, 1990  Age: 32 y.o. MRN: [id number]  Chief Complaint  Patient presents with   Obesity   std screening   Medication Refill   tounge issue    Patient is a 32 year old female seen for follow-up and screening.  Patient states she just got out of a relationship and wanted to get STI screening.  Denies current symptoms.  Patient notes white film on tongue.  Denies edema, itching, burning, irritation of tongue.  In the past patient had thrush which looks similar.  Patient requesting refills on Protonix and phentermine.  Phentermine 15 mg prescribed February 2024.  Patient states she is not taking medication consistently, but did notice decreased appetite when taking.  Patient with like to restart medication.  Exercising regularly.  Patient recently moved into her first home with her daughter.  Exposure to STD     Past Medical History:  Diagnosis Date   Chlamydia infection    Gonorrhea    H/O varicella    Hx: UTI (urinary tract infection)    Past Surgical History:  Procedure Laterality Date   WISDOM TOOTH EXTRACTION     Social History   Tobacco Use   Smoking status: Never   Smokeless tobacco: Never  Vaping Use   Vaping status: Never Used  Substance Use Topics   Alcohol use: No    Comment: quit 2018   Drug use: No   Family History  Problem Relation Age of Onset   Hypertension Mother    Arthritis Mother    Thyroid disease Mother    Asthma Brother    Thalassemia Brother    Alcohol abuse Father    Diabetes Maternal Aunt    Anesthesia problems Neg Hx    Allergies  Allergen Reactions   Blueberry [Vaccinium Angustifolium] Nausea And Vomiting      ROS Negative unless stated above    Objective:     BP 108/72 (BP Location: Left Arm, Patient Position: Sitting, Cuff Size: Normal)   Pulse 81   Temp 98.1 F (36.7 C) (Oral)   Ht 5' 0.75" (1.543 m)   Wt 175 lb 9.6 oz  (79.7 kg)   LMP 04/12/2023 (Exact Date)   SpO2 97%   BMI 33.45 kg/m  BP Readings from Last 3 Encounters:  04/13/23 108/72  09/22/22 118/80  06/11/22 (!) 126/98   Wt Readings from Last 3 Encounters:  04/13/23 175 lb 9.6 oz (79.7 kg)  09/22/22 175 lb (79.4 kg)  06/11/22 179 lb 12.8 oz (81.6 kg)      Physical Exam Constitutional:      General: She is not in acute distress.    Appearance: Normal appearance.  HENT:     Head: Normocephalic and atraumatic.     Nose: Nose normal.     Mouth/Throat:     Mouth: Mucous membranes are moist.     Comments: Thick whitish film on tongue.  No edema, erythema, or lesions noted. Cardiovascular:     Rate and Rhythm: Normal rate and regular rhythm.     Heart sounds: Normal heart sounds. No murmur heard.    No gallop.  Pulmonary:     Effort: Pulmonary effort is normal. No respiratory distress.     Breath sounds: Normal breath sounds. No wheezing, rhonchi or rales.  Skin:    General: Skin is warm and dry.  Neurological:     Mental Status: She is  alert and oriented to person, place, and time.      No results found for any visits on 04/13/23.    Assessment & Plan:  Routine screening for STI (sexually transmitted infection) -     C. trachomatis/N. gonorrhoeae RNA -     HIV Antibody (routine testing w rflx) -     RPR  Gastroesophageal reflux disease, unspecified whether esophagitis present -Avoid foods known to cause symptoms -     Pantoprazole Sodium; Take 1 tablet (20 mg total) by mouth daily.  Dispense: 30 tablet; Refill: 3  Class 1 obesity due to excess calories without serious comorbidity with body mass index (BMI) of 33.0 to 33.9 in adult -Body mass index is 33.45 kg/m. -Discussed the importance of taking medication consistently -Continue lifestyle modifications including diet and exercise changes -PDMP reviewed and appropriate. -Follow-up in 2 months -     Phentermine HCl; Take 1 capsule (15 mg total) by mouth daily before  breakfast.  Dispense: 30 capsule; Refill: 1  Thrush -     magic mouthwash; Swish and spit 5 mL 4 times daily x 7 days.  Dispense: 160 mL; Refill: 0    Return in about 2 months (around 06/13/2023).   Deeann Saint, MD

## 2023-04-14 LAB — RPR: RPR Ser Ql: NONREACTIVE

## 2023-04-14 LAB — HIV ANTIBODY (ROUTINE TESTING W REFLEX): HIV 1&2 Ab, 4th Generation: NONREACTIVE

## 2023-04-14 LAB — C. TRACHOMATIS/N. GONORRHOEAE RNA
C. trachomatis RNA, TMA: NOT DETECTED
N. gonorrhoeae RNA, TMA: NOT DETECTED

## 2023-04-26 ENCOUNTER — Telehealth: Payer: Self-pay

## 2023-04-26 ENCOUNTER — Other Ambulatory Visit (HOSPITAL_COMMUNITY): Payer: Self-pay

## 2023-04-26 NOTE — Telephone Encounter (Signed)
Pharmacy Patient Advocate Encounter   Received notification from CoverMyMeds that prior authorization for Phentermine HCl 15MG  capsules is required/requested.   Insurance verification completed.   The patient is insured through [facility] .   Per test claim: APPROVED from 04/26/23 to 07/25/23. Ran test claim, Copay is $10. This test claim was processed through [facility]- copay amounts may vary at other pharmacies due to pharmacy/plan contracts, or as the patient moves through the different stages of their insurance plan.   Key: [id number]

## 2023-04-26 NOTE — Telephone Encounter (Signed)
Called patient she is aware of Approval

## 2023-09-05 DIAGNOSIS — K219 Gastro-esophageal reflux disease without esophagitis: Secondary | ICD-10-CM | POA: Insufficient documentation

## 2023-10-06 ENCOUNTER — Encounter: Admitting: Family Medicine

## 2023-10-10 ENCOUNTER — Other Ambulatory Visit (HOSPITAL_COMMUNITY)
Admission: RE | Admit: 2023-10-10 | Discharge: 2023-10-10 | Disposition: A | Discharge status: Home / Self Care | Source: Ambulatory Visit | Attending: Family Medicine | Admitting: Family Medicine

## 2023-10-10 ENCOUNTER — Ambulatory Visit (INDEPENDENT_AMBULATORY_CARE_PROVIDER_SITE_OTHER): Admitting: Family Medicine

## 2023-10-10 ENCOUNTER — Encounter: Payer: Self-pay | Admitting: Family Medicine

## 2023-10-10 VITALS — BP 134/86 | HR 76 | Temp 97.9°F | Ht 60.75 in | Wt 172.2 lb

## 2023-10-10 DIAGNOSIS — K219 Gastro-esophageal reflux disease without esophagitis: Secondary | ICD-10-CM | POA: Diagnosis not present

## 2023-10-10 DIAGNOSIS — E6609 Other obesity due to excess calories: Secondary | ICD-10-CM

## 2023-10-10 DIAGNOSIS — E66811 Obesity, class 1: Secondary | ICD-10-CM | POA: Diagnosis not present

## 2023-10-10 DIAGNOSIS — Z113 Encounter for screening for infections with a predominantly sexual mode of transmission: Secondary | ICD-10-CM | POA: Insufficient documentation

## 2023-10-10 DIAGNOSIS — Z6833 Body mass index (BMI) 33.0-33.9, adult: Secondary | ICD-10-CM | POA: Diagnosis not present

## 2023-10-10 DIAGNOSIS — Z0000 Encounter for general adult medical examination without abnormal findings: Secondary | ICD-10-CM

## 2023-10-10 DIAGNOSIS — R030 Elevated blood-pressure reading, without diagnosis of hypertension: Secondary | ICD-10-CM

## 2023-10-10 DIAGNOSIS — Z13220 Encounter for screening for lipoid disorders: Secondary | ICD-10-CM | POA: Diagnosis not present

## 2023-10-10 LAB — COMPREHENSIVE METABOLIC PANEL
ALT: 23 U/L (ref 0–35)
AST: 19 U/L (ref 0–37)
Albumin: 4.1 g/dL (ref 3.5–5.2)
Alkaline Phosphatase: 55 U/L (ref 39–117)
BUN: 12 mg/dL (ref 6–23)
CO2: 28 meq/L (ref 19–32)
Calcium: 9.1 mg/dL (ref 8.4–10.5)
Chloride: 101 meq/L (ref 96–112)
Creatinine, Ser: 0.58 mg/dL (ref 0.40–1.20)
GFR: 121.29 mL/min (ref >60.00)
Glucose, Bld: 89 mg/dL (ref 70–99)
Potassium: 4.1 meq/L (ref 3.5–5.1)
Sodium: 135 meq/L (ref 135–145)
Total Bilirubin: 0.2 mg/dL (ref 0.2–1.2)
Total Protein: 7.5 g/dL (ref 6.0–8.3)

## 2023-10-10 LAB — LIPID PANEL
Cholesterol: 167 mg/dL (ref 0–200)
HDL: 50.7 mg/dL (ref >39.00)
LDL Cholesterol: 109 mg/dL — ABNORMAL HIGH (ref 0–99)
NonHDL: 116.02
Total CHOL/HDL Ratio: 3
Triglycerides: 33 mg/dL (ref 0.0–149.0)
VLDL: 6.6 mg/dL (ref 0.0–40.0)

## 2023-10-10 LAB — CBC WITH DIFFERENTIAL/PLATELET
Basophils Absolute: 0 10*3/uL (ref 0.0–0.1)
Basophils Relative: 0.5 % (ref 0.0–3.0)
Eosinophils Absolute: 0.2 10*3/uL (ref 0.0–0.7)
Eosinophils Relative: 1.9 % (ref 0.0–5.0)
HCT: 39.8 % (ref 36.0–46.0)
Hemoglobin: 13.2 g/dL (ref 12.0–15.0)
Lymphocytes Relative: 16.8 % (ref 12.0–46.0)
Lymphs Abs: 1.5 10*3/uL (ref 0.7–4.0)
MCHC: 33.2 g/dL (ref 30.0–36.0)
MCV: 85.7 fl (ref 78.0–100.0)
Monocytes Absolute: 0.6 10*3/uL (ref 0.1–1.0)
Monocytes Relative: 6.2 % (ref 3.0–12.0)
Neutro Abs: 6.8 10*3/uL (ref 1.4–7.7)
Neutrophils Relative %: 74.6 % (ref 43.0–77.0)
Platelets: 413 10*3/uL — ABNORMAL HIGH (ref 150.0–400.0)
RBC: 4.64 Mil/uL (ref 3.87–5.11)
RDW: 12.8 % (ref 11.5–15.5)
WBC: 9.1 10*3/uL (ref 4.0–10.5)

## 2023-10-10 LAB — TSH: TSH: 2.34 u[IU]/mL (ref 0.35–5.50)

## 2023-10-10 LAB — T4, FREE: Free T4: 0.84 ng/dL (ref 0.60–1.60)

## 2023-10-10 LAB — HEMOGLOBIN A1C: Hgb A1c MFr Bld: 5.5 % (ref 4.6–6.5)

## 2023-10-10 MED ORDER — PANTOPRAZOLE SODIUM 40 MG PO TBEC: 40.0000 mg | DELAYED_RELEASE_TABLET | Freq: Every day | ORAL | 3 refills | Status: AC

## 2023-10-10 NOTE — Progress Notes (Signed)
 Established Patient Office Visit   Subjective  Patient ID: Tracy Haynes, female    DOB: 09/14/1990  Age: 33 y.o. MRN: [id number]  Chief Complaint  Patient presents with   Annual Exam    Patient is a 33 year old female seen for CPE.  Patient requesting STI screening.  Mentions concern for possible BV or yeast infection to LPN, but not this provider. Pt requesting refills on protonix and phentermine.  Having to take two 20 mg protonix tabs to get relief.  Now having symptoms daily/with most foods.  Seen by GI. Has not been on phentermine in months for wt loss.  Not exercising.  Pt unsure why bp elevated.  Thinks may be related to eating wings.  May drink 2 bottles of water per day.      Patient Active Problem List   Diagnosis Date Noted   Laryngopharyngeal reflux (LPR) 09/05/2023   Goiter 06/16/2018   Headache 10/18/2017   Vaginal laceration 02/09/2012   Normal pregnancy, first 11/17/2011   Gonorrhea 11/11/2011   Chlamydia infection, current pregnancy 11/11/2011   Past Medical History:  Diagnosis Date   Chlamydia infection    GERD (gastroesophageal reflux disease) 2024   Gonorrhea    H/O varicella    Hx: UTI (urinary tract infection)    Past Surgical History:  Procedure Laterality Date   WISDOM TOOTH EXTRACTION     Social History   Tobacco Use   Smoking status: Never   Smokeless tobacco: Never  Vaping Use   Vaping status: Never Used  Substance Use Topics   Alcohol use: Not Currently    Alcohol/week: 1.0 standard drink of alcohol    Types: 1 Glasses of wine per week    Comment: Maybe one glass a wine per month   Drug use: No   Family History  Problem Relation Age of Onset   Hypertension Mother    Arthritis Mother    Thyroid disease Mother    Asthma Brother    Thalassemia Brother    Alcohol abuse Father    Diabetes Maternal Aunt    Anesthesia problems Neg Hx    Allergies  Allergen Reactions   Blueberry [Vaccinium Angustifolium] Nausea And Vomiting       ROS Negative unless stated above    Objective:     BP 134/86 (BP Location: Left Arm, Patient Position: Sitting, Cuff Size: Large)   Pulse 76   Temp 97.9 F (36.6 C) (Oral)   Ht 5' 0.75" (1.543 m)   Wt 172 lb 3.2 oz (78.1 kg)   LMP 10/03/2023 (Exact Date)   SpO2 97%   BMI 32.81 kg/m  BP Readings from Last 3 Encounters:  10/10/23 134/86  04/13/23 108/72  09/22/22 118/80   Wt Readings from Last 3 Encounters:  10/10/23 172 lb 3.2 oz (78.1 kg)  04/13/23 175 lb 9.6 oz (79.7 kg)  09/22/22 175 lb (79.4 kg)      Physical Exam Constitutional:      Appearance: Normal appearance.  HENT:     Head: Normocephalic and atraumatic.     Right Ear: Tympanic membrane, ear canal and external ear normal.     Left Ear: Tympanic membrane, ear canal and external ear normal.     Nose: Nose normal.     Mouth/Throat:     Mouth: Mucous membranes are moist.     Pharynx: No oropharyngeal exudate or posterior oropharyngeal erythema.  Eyes:     General: No scleral icterus.    Extraocular Movements:  Extraocular movements intact.     Conjunctiva/sclera: Conjunctivae normal.     Pupils: Pupils are equal, round, and reactive to light.  Neck:     Thyroid: No thyromegaly.  Cardiovascular:     Rate and Rhythm: Normal rate and regular rhythm.     Pulses: Normal pulses.     Heart sounds: Normal heart sounds. No murmur heard.    No friction rub.  Pulmonary:     Effort: Pulmonary effort is normal.     Breath sounds: Normal breath sounds. No wheezing, rhonchi or rales.  Abdominal:     General: Bowel sounds are normal.     Palpations: Abdomen is soft.     Tenderness: There is no abdominal tenderness.  Musculoskeletal:        General: No deformity. Normal range of motion.  Lymphadenopathy:     Cervical: No cervical adenopathy.  Skin:    General: Skin is warm and dry.     Findings: No lesion.  Neurological:     General: No focal deficit present.     Mental Status: She is alert and  oriented to person, place, and time.  Psychiatric:        Mood and Affect: Mood normal.        Thought Content: Thought content normal.       10/10/2023   10:42 AM 04/13/2023    9:03 AM 04/13/2023    8:47 AM  Depression screen PHQ 2/9  Decreased Interest 0 0 0  Down, Depressed, Hopeless 0 0 0  PHQ - 2 Score 0 0 0  Altered sleeping 1 0 1  Tired, decreased energy 1 0 1  Change in appetite 1 0 1  Feeling bad or failure about yourself  0 0 0  Trouble concentrating 0 0 0  Moving slowly or fidgety/restless 0 0 0  Suicidal thoughts 0 0 0  PHQ-9 Score 3 0 3  Difficult doing work/chores Not difficult at all Not difficult at all Not difficult at all      10/10/2023   10:42 AM 04/13/2023    9:03 AM  GAD 7 : Generalized Anxiety Score  Nervous, Anxious, on Edge 0 0  Control/stop worrying 0 0  Worry too much - different things 0 0  Trouble relaxing 0 0  Restless 0 0  Easily annoyed or irritable 0 0  Afraid - awful might happen 0 0  Total GAD 7 Score 0 0  Anxiety Difficulty Not difficult at all Not difficult at all       No results found for any visits on 10/10/23.    Assessment & Plan:  Well adult exam -     CBC with Differential/Platelet; Future -     Comprehensive metabolic panel; Future -     Hemoglobin A1c; Future -     Lipid panel; Future -     TSH; Future -     T4, free; Future  Class 1 obesity due to excess calories without serious comorbidity with body mass index (BMI) of 33.0 to 33.9 in adult -Body mass index is 32.81 kg/m. -Briefly on phentermine 15 mg daily. -Was 175lbs 04/2023.  Currently 172 lbs. -     Comprehensive metabolic panel; Future -     Hemoglobin A1c; Future -     Lipid panel; Future  Gastroesophageal reflux disease, unspecified whether esophagitis present -     Comprehensive metabolic panel; Future -     Pantoprazole Sodium; Take 1 tablet (40 mg total)  by mouth daily.  Dispense: 90 tablet; Refill: 3  Elevated blood pressure reading in office  without diagnosis of hypertension  Routine screening for STI (sexually transmitted infection) -     HIV Antibody (routine testing w rflx) -     RPR -     C. trachomatis/N. gonorrhoeae RNA -     Cervicovaginal ancillary only  Age-appropriate health screenings discussed.  Will obtain labs and STI testing.  Immunizations reviewed.  Last Pap 08/2021.  Repeat due in 2026.  Next CPE in 1 year.  Patient encouraged to increase physical activity and make modifications to diet.  Will increase Protonix from 20 mg daily to 40 mg daily.  Patient advised to avoid foods known to cause symptoms.  For continued or worsening GERD symptoms schedule follow-up with GI.  BP elevated in clinic, 150/102.  Recheck 134/86.  Given elevation will hold off on refilling phentermine.  Work on consistent lifestyle modifications for 3 months.  Patient encouraged to monitor BP at home.  Follow-up in 4 to 6 weeks for BP recheck.  Return in about 5 weeks (around 11/14/2023) for blood pressure check.   Deeann Saint, MD

## 2023-10-10 NOTE — Patient Instructions (Signed)
 Your last Pap was done in 08/13/2021.  Next Pap due 2026.

## 2023-10-11 ENCOUNTER — Ambulatory Visit: Admitting: Adult Health

## 2023-10-11 LAB — C. TRACHOMATIS/N. GONORRHOEAE RNA
C. trachomatis RNA, TMA: NOT DETECTED
N. gonorrhoeae RNA, TMA: NOT DETECTED

## 2023-10-11 LAB — CERVICOVAGINAL ANCILLARY ONLY
Bacterial Vaginitis (gardnerella): NEGATIVE
Candida Glabrata: NEGATIVE
Candida Vaginitis: NEGATIVE
Comment: NEGATIVE
Comment: NEGATIVE
Comment: NEGATIVE

## 2023-10-11 LAB — RPR: RPR Ser Ql: NONREACTIVE

## 2023-10-11 LAB — HIV ANTIBODY (ROUTINE TESTING W REFLEX): HIV 1&2 Ab, 4th Generation: NONREACTIVE

## 2023-10-13 ENCOUNTER — Encounter: Payer: Self-pay | Admitting: Family Medicine

## 2023-11-14 ENCOUNTER — Ambulatory Visit: Admitting: Family Medicine

## 2024-02-02 DIAGNOSIS — Z113 Encounter for screening for infections with a predominantly sexual mode of transmission: Secondary | ICD-10-CM | POA: Diagnosis not present

## 2024-02-02 DIAGNOSIS — N949 Unspecified condition associated with female genital organs and menstrual cycle: Secondary | ICD-10-CM | POA: Diagnosis not present

## 2024-04-25 ENCOUNTER — Encounter: Payer: Self-pay | Admitting: Family Medicine

## 2024-04-25 ENCOUNTER — Telehealth: Payer: Self-pay | Admitting: Family Medicine

## 2024-04-25 ENCOUNTER — Ambulatory Visit: Admitting: Family Medicine

## 2024-04-25 ENCOUNTER — Other Ambulatory Visit (HOSPITAL_COMMUNITY): Payer: Self-pay

## 2024-04-25 ENCOUNTER — Telehealth: Payer: Self-pay

## 2024-04-25 VITALS — BP 128/74 | HR 75 | Temp 98.6°F | Ht 60.75 in | Wt 181.4 lb

## 2024-04-25 DIAGNOSIS — R339 Retention of urine, unspecified: Secondary | ICD-10-CM | POA: Diagnosis not present

## 2024-04-25 DIAGNOSIS — Z6834 Body mass index (BMI) 34.0-34.9, adult: Secondary | ICD-10-CM

## 2024-04-25 DIAGNOSIS — E66811 Obesity, class 1: Secondary | ICD-10-CM

## 2024-04-25 DIAGNOSIS — Z113 Encounter for screening for infections with a predominantly sexual mode of transmission: Secondary | ICD-10-CM | POA: Diagnosis not present

## 2024-04-25 DIAGNOSIS — B370 Candidal stomatitis: Secondary | ICD-10-CM

## 2024-04-25 DIAGNOSIS — R300 Dysuria: Secondary | ICD-10-CM | POA: Diagnosis not present

## 2024-04-25 LAB — POCT URINALYSIS DIPSTICK
Bilirubin, UA: NEGATIVE
Blood, UA: NEGATIVE
Glucose, UA: NEGATIVE
Ketones, UA: NEGATIVE
Leukocytes, UA: NEGATIVE
Nitrite, UA: NEGATIVE
Protein, UA: POSITIVE — AB
Spec Grav, UA: >=1.03 — AB (ref 1.010–1.025)
Urobilinogen, UA: 0.2 U/dL
pH, UA: 6 (ref 5.0–8.0)

## 2024-04-25 MED ORDER — PREDNISOLONE SODIUM PHOSPHATE 15 MG/5ML PO SOLN
5.0000 mL | Freq: Four times a day (QID) | OROMUCOSAL | 0 refills | Status: DC
Start: 2024-04-25 — End: 2024-04-30

## 2024-04-25 MED ORDER — PHENTERMINE HCL 15 MG PO CAPS: 15.0000 mg | ORAL_CAPSULE | ORAL | 2 refills | Status: AC

## 2024-04-25 NOTE — Telephone Encounter (Signed)
 Pharmacy Patient Advocate Encounter   Received notification from Onbase that prior authorization for Phentermine  Hcl i15 mg caps is required/requested.   Insurance verification completed.   The patient is insured through [facility] .   Per test claim: PA required; PA submitted to above mentioned insurance via Latent Key/confirmation #/[id number]	[place] Status is pending

## 2024-04-25 NOTE — Progress Notes (Signed)
 Established Patient Office Visit   Subjective  Patient ID: Tracy Haynes, female    DOB: [birth year]  Age: 33 y.o. MRN: [id number]  Chief Complaint  Patient presents with   Acute Visit    Patient came in today for UTI, patient is having urine retention, Burning, and odor, started 4 days ago     Is a 33 year old female seen for acute concern.  Patient endorses difficulty urinating, burning sensation, mild odor x 4 days.  Patient states she may also have thin white vaginal discharge.  Patient requesting STI testing as she recently found out her partner was unfaithful.  Pt also mentions white coating and soreness of tongue and throat.  Unsure if related to reflux.  Patient inquires about restarting phentermine .  In the past medication stopped due to BP elevation.  Pt states at that time she was eating fast food, now cooking at home.  Drinking mostly water, coffee, and redbull.    Patient Active Problem List   Diagnosis Date Noted   Laryngopharyngeal reflux (LPR) 09/05/2023   Goiter 06/16/2018   Headache 10/18/2017   Vaginal laceration 02/09/2012   Normal pregnancy, first 11/17/2011   Gonorrhea 11/11/2011   Chlamydia infection, current pregnancy 11/11/2011   Past Medical History:  Diagnosis Date   Chlamydia infection    GERD (gastroesophageal reflux disease) 2024   Gonorrhea    H/O varicella    Hx: UTI (urinary tract infection)    Past Surgical History:  Procedure Laterality Date   WISDOM TOOTH EXTRACTION     Social History   Tobacco Use   Smoking status: Never   Smokeless tobacco: Never  Vaping Use   Vaping status: Never Used  Substance Use Topics   Alcohol use: Not Currently    Alcohol/week: 1.0 standard drink of alcohol    Types: 1 Glasses of wine per week    Comment: Maybe one glass a wine per month   Drug use: No   Family History  Problem Relation Age of Onset   Hypertension Mother    Arthritis Mother    Thyroid  disease Mother    Asthma Brother     Thalassemia Brother    Alcohol abuse Father    Diabetes Maternal Aunt    Anesthesia problems Neg Hx    Allergies  Allergen Reactions   Blueberry [Vaccinium Angustifolium] Nausea And Vomiting    ROS Negative unless stated above    Objective:     BP 128/74 (BP Location: Right Arm, Patient Position: Sitting, Cuff Size: Large)   Pulse 75   Temp 98.6 F (37 C) (Oral)   Ht 5' 0.75 (1.543 m)   Wt 181 lb 6.4 oz (82.3 kg)   LMP 04/09/2024 (Exact Date)   SpO2 98%   BMI 34.56 kg/m  BP Readings from Last 3 Encounters:  04/25/24 128/74  10/10/23 134/86  04/13/23 108/72   Wt Readings from Last 3 Encounters:  04/25/24 181 lb 6.4 oz (82.3 kg)  10/10/23 172 lb 3.2 oz (78.1 kg)  04/13/23 175 lb 9.6 oz (79.7 kg)      Physical Exam Constitutional:      General: She is not in acute distress.    Appearance: Normal appearance.  HENT:     Head: Normocephalic and atraumatic.     Nose: Nose normal.     Mouth/Throat:     Mouth: Mucous membranes are moist.     Tonsils: 1+ on the right. 2+ on the left.     Comments:  Thick white coating of tongue.   Cardiovascular:     Rate and Rhythm: Normal rate and regular rhythm.     Heart sounds: Normal heart sounds. No murmur heard.    No gallop.  Pulmonary:     Effort: Pulmonary effort is normal. No respiratory distress.     Breath sounds: Normal breath sounds. No wheezing, rhonchi or rales.  Abdominal:     General: Bowel sounds are normal.     Palpations: Abdomen is soft.     Tenderness: There is no abdominal tenderness. There is no right CVA tenderness, left CVA tenderness or guarding.  Skin:    General: Skin is warm and dry.  Neurological:     Mental Status: She is alert and oriented to person, place, and time.        10/10/2023   10:42 AM 04/13/2023    9:03 AM 04/13/2023    8:47 AM  Depression screen PHQ 2/9  Decreased Interest 0 0 0  Down, Depressed, Hopeless 0 0 0  PHQ - 2 Score 0 0 0  Altered sleeping 1 0 1  Tired,  decreased energy 1 0 1  Change in appetite 1 0 1  Feeling bad or failure about yourself  0 0 0  Trouble concentrating 0 0 0  Moving slowly or fidgety/restless 0 0 0  Suicidal thoughts 0 0 0  PHQ-9 Score 3 0 3  Difficult doing work/chores Not difficult at all Not difficult at all Not difficult at all      10/10/2023   10:42 AM 04/13/2023    9:03 AM  GAD 7 : Generalized Anxiety Score  Nervous, Anxious, on Edge 0 0  Control/stop worrying 0 0  Worry too much - different things 0 0  Trouble relaxing 0 0  Restless 0 0  Easily annoyed or irritable 0 0  Afraid - awful might happen 0 0  Total GAD 7 Score 0 0  Anxiety Difficulty Not difficult at all Not difficult at all     Results for orders placed or performed in visit on 04/25/24  POC Urinalysis Dipstick  Result Value Ref Range   Color, UA yellow    Clarity, UA clear    Glucose, UA Negative Negative   Bilirubin, UA neg    Ketones, UA neg    Spec Grav, UA >=1.030 (A) 1.010 - 1.025   Blood, UA neg    pH, UA 6.0 5.0 - 8.0   Protein, UA Positive (A) Negative   Urobilinogen, UA 0.2 0.2 or 1.0 E.U./dL   Nitrite, UA neg    Leukocytes, UA Negative Negative   Appearance     Odor        Assessment & Plan:   Dysuria -     POCT urinalysis dipstick -     C. trachomatis/N. gonorrhoeae RNA; Future -     Urine Culture; Future  Urine retention -     POCT urinalysis dipstick -     C. trachomatis/N. gonorrhoeae RNA; Future -     Urine Culture; Future  Routine screening for STI (sexually transmitted infection) -     HIV Antibody (routine testing w rflx); Future -     RPR; Future -     C. trachomatis/N. gonorrhoeae RNA; Future  Thrush -     magic mouthwash (multi-ingredient) oral suspension; Swish and spit 5 mLs 4 (four) times daily for 5 days.  Dispense: 120 mL; Refill: 0  Class 1 obesity without serious comorbidity with  body mass index (BMI) of 34.0 to 34.9 in adult, unspecified obesity type -     Phentermine  HCl; Take 1  capsule (15 mg total) by mouth every morning.  Dispense: 30 capsule; Refill: 2   Acute Urinary symptoms.  POC UA with SG>1.030, otherwise negative.  Obtain UCx.  Increase water intake.  STI testing.  Further recs based on results.  Magic mouth wash for thrush possible caused by gerd.  Body mass index is 34.56 kg/m.  Wt 181.4 lbs.  Restart phentermine  15 mg daily.  Pt advised to monitor bp and take medicine consistently as was not doing so in the past.  F/u in 2-3 months, sooner if needed.  Return in about 2 months (around 06/25/2024) for weight and bp.   Clotilda JONELLE Single, MD

## 2024-04-25 NOTE — Telephone Encounter (Signed)
 Pt request a work excuse for today be loaded to her mychart. thx

## 2024-04-25 NOTE — Telephone Encounter (Signed)
 Pharmacy Patient Advocate Encounter  Received notification from [facility] that Prior Authorization for Phentermine  Hcl 15 has been DENIED.  Full denial letter will be uploaded to the media tab. See denial reason below.     PA #/Case ID/Reference #: [id number]

## 2024-04-26 LAB — HIV ANTIBODY (ROUTINE TESTING W REFLEX)
HIV 1&2 Ab, 4th Generation: NONREACTIVE
HIV FINAL INTERPRETATION: NEGATIVE

## 2024-04-26 LAB — C. TRACHOMATIS/N. GONORRHOEAE RNA
C. trachomatis RNA, TMA: NOT DETECTED
N. gonorrhoeae RNA, TMA: NOT DETECTED

## 2024-04-26 LAB — URINE CULTURE
MICRO NUMBER:: 17011042
Result:: NO GROWTH
SPECIMEN QUALITY:: ADEQUATE

## 2024-04-26 LAB — RPR: RPR Ser Ql: NONREACTIVE

## 2024-04-27 ENCOUNTER — Telehealth: Payer: Self-pay

## 2024-04-27 DIAGNOSIS — B370 Candidal stomatitis: Secondary | ICD-10-CM

## 2024-04-27 MED ORDER — PREDNISOLONE SODIUM PHOSPHATE 15 MG/5ML PO SOLN: 5.0000 mL | Freq: Four times a day (QID) | OROMUCOSAL | 0 refills | Status: DC

## 2024-04-27 NOTE — Addendum Note (Signed)
 Addended by: BRIEN SONG A on: 04/27/2024 02:57 PM   Modules accepted: Orders

## 2024-04-27 NOTE — Telephone Encounter (Signed)
 Copied from CRM #[id number]. Topic: Clinical - Medication Question >> Apr 27, 2024 12:40 PM Dedra B wrote: Reason for CRM: Hargis from [facility] needs to know what ingredients and ratio the provider wants for the magic mouthwash. Call [facility] [place] at [phone]

## 2024-04-27 NOTE — Telephone Encounter (Signed)
 Spoke with patient, resent medication to pharmacy

## 2024-04-27 NOTE — Telephone Encounter (Signed)
 Copied from CRM [id number]. Topic: Clinical - Prescription Issue >> Apr 27, 2024 12:34 PM Tracy Haynes wrote: Reason for CRM: Patient is calling in stating she took the order for the magic mouthwash (multi-ingredient) oral suspension  to the pharmacy and they can not fill it due to not having the ingredients on it. Patient is asking if the office can reach out to the pharmacy with this.

## 2024-05-01 ENCOUNTER — Other Ambulatory Visit (HOSPITAL_COMMUNITY): Payer: Self-pay

## 2024-05-01 MED ORDER — PREDNISOLONE SODIUM PHOSPHATE 15 MG/5ML PO SOLN
5.0000 mL | Freq: Four times a day (QID) | OROMUCOSAL | 0 refills | Status: AC
  Filled 2024-05-01: qty 120, 5d supply, fill #0

## 2024-05-01 NOTE — Addendum Note (Signed)
 Addended by: BRIEN SONG A on: 05/01/2024 08:02 AM   Modules accepted: Orders

## 2024-05-02 NOTE — Telephone Encounter (Signed)
 Per review of chart/messages.  1.Phentermine  likely not covered at pt's pharmacy ([facility]) b/c [facility] is probably [facility] based on her insurance.  2. Magic mouth was rx was printed out and hand to pt on day of visit as it typically doesn't e-scribe.  If not covered pt can take the paper rx to a diff pharmacy.

## 2024-05-03 ENCOUNTER — Ambulatory Visit: Payer: Self-pay | Admitting: Family Medicine

## 2024-05-03 NOTE — Telephone Encounter (Signed)
 Spoke with patient, [facility] has the mouth wash, and patient was able to pick up phentermine 

## 2024-05-09 ENCOUNTER — Other Ambulatory Visit (HOSPITAL_COMMUNITY): Payer: Self-pay

## 2024-05-11 DIAGNOSIS — F4323 Adjustment disorder with mixed anxiety and depressed mood: Secondary | ICD-10-CM | POA: Diagnosis not present

## 2024-06-22 DIAGNOSIS — F4323 Adjustment disorder with mixed anxiety and depressed mood: Secondary | ICD-10-CM | POA: Diagnosis not present

## 2024-07-11 DIAGNOSIS — F4323 Adjustment disorder with mixed anxiety and depressed mood: Secondary | ICD-10-CM | POA: Diagnosis not present

## 2024-07-23 DIAGNOSIS — F4323 Adjustment disorder with mixed anxiety and depressed mood: Secondary | ICD-10-CM | POA: Diagnosis not present

## 2024-10-10 ENCOUNTER — Encounter: Admitting: Family Medicine
